# Patient Record
Sex: Male | Born: 1960
Health system: Southern US, Community
[De-identification: ages and names within clinical notes are randomized; demographics above are authoritative.]

## PROBLEM LIST (undated history)

## (undated) DIAGNOSIS — I739 Peripheral vascular disease, unspecified: Secondary | ICD-10-CM

## (undated) DIAGNOSIS — M1990 Unspecified osteoarthritis, unspecified site: Secondary | ICD-10-CM

## (undated) HISTORY — PX: VARICOSE VEIN SURGERY: SHX832

## (undated) HISTORY — PX: KNEE ARTHROSCOPY: SHX127

---

## 2000-04-12 ENCOUNTER — Ambulatory Visit (HOSPITAL_COMMUNITY): Admission: RE | Admit: 2000-04-12 | Discharge: 2000-04-12 | Payer: Self-pay | Admitting: Plastic Surgery

## 2000-04-12 ENCOUNTER — Encounter: Payer: Self-pay | Admitting: Plastic Surgery

## 2000-04-14 ENCOUNTER — Encounter: Payer: Self-pay | Admitting: Plastic Surgery

## 2000-04-14 ENCOUNTER — Ambulatory Visit (HOSPITAL_COMMUNITY): Admission: RE | Admit: 2000-04-14 | Discharge: 2000-04-14 | Payer: Self-pay | Admitting: Plastic Surgery

## 2000-04-20 ENCOUNTER — Other Ambulatory Visit: Admission: RE | Admit: 2000-04-20 | Discharge: 2000-04-20 | Payer: Self-pay | Admitting: *Deleted

## 2000-04-25 ENCOUNTER — Ambulatory Visit (HOSPITAL_COMMUNITY): Admission: RE | Admit: 2000-04-25 | Discharge: 2000-04-25 | Payer: Self-pay | Admitting: Otolaryngology

## 2000-04-25 ENCOUNTER — Encounter: Payer: Self-pay | Admitting: Otolaryngology

## 2000-06-29 ENCOUNTER — Ambulatory Visit (HOSPITAL_BASED_OUTPATIENT_CLINIC_OR_DEPARTMENT_OTHER): Admission: RE | Admit: 2000-06-29 | Discharge: 2000-06-29 | Payer: Self-pay | Admitting: Plastic Surgery

## 2000-07-04 ENCOUNTER — Ambulatory Visit (HOSPITAL_BASED_OUTPATIENT_CLINIC_OR_DEPARTMENT_OTHER): Admission: RE | Admit: 2000-07-04 | Discharge: 2000-07-04 | Payer: Self-pay | Admitting: Plastic Surgery

## 2000-11-09 ENCOUNTER — Ambulatory Visit (HOSPITAL_BASED_OUTPATIENT_CLINIC_OR_DEPARTMENT_OTHER): Admission: RE | Admit: 2000-11-09 | Discharge: 2000-11-09 | Payer: Self-pay | Admitting: Plastic Surgery

## 2002-02-16 IMAGING — RF IR [PERSON_NAME]/EXT/UNI*R*
9 series · 16 of 16 positions shown · non-contrast
Comparison: none

FINDINGS
CLINICAL DATA: VENOUS MALFORMATION IN THE RIGHT UPPER EXTREMITY.
RIGHT UPPER EXTREMITY VENOGRAM 04/14/00:
INITIALLY, A LARGE BORE IV WAS STARTED IN THE PATIENT'S WRIST.  VENOGRAM PERFORMED OVER THE FOREARM
REGION SHOWS TWO ABNORMAL APPEARING VEINS, ONE IN THE MID TO DISTAL FOREARM AND ONE IN THE PROXIMAL
FOREARM LATERALLY.  THERE ARE TWO AREAS WITHIN THESE VEINS THAT APPEAR SOMEWHAT DILATED AND
IRREGULAR.  THERE ARE MULTIPLE FILLING VESSELS TO EACH OF THESE ABNORMAL AREAS.  THE FILLING VEINS
ARE VERY SMALL.  THE REMAINDER OF THE VENOUS MALFORMATIONS SEEN ON PHYSICAL EXAM DO NOT FILL ON
VENOGRAM.  ON PHYSICAL EXAM, THERE IS A LARGE APPARENT VENOUS MALFORMATION LATERALLY IN THE FOREARM
JUST BELOW THE ELBOW AND MULTIPLE ABOVE THE ELBOW ALONG THE VENTRAL SURFACE.  NONE OF THESE AREAS
FILL WITH THE VENOGRAM.  THEREFORE, A SECOND IV WAS STARTED IN THE ANTECUBICLE FOSSA WITH A 20
GAUGE ANGIOCATH.  REPEAT VENOGRAM WAS PERFORMED BOTH WITH THE TOURNIQUET DOWN AND WITH THE
TOURNIQUET TIGHTENED.  AGAIN, ONLY THE DEEP VEINS OF THE UPPER ARM FILL WITHOUT FILLING OF THE
ABNORMAL VEINS.
IMPRESSION
1.  ONLY TWO ABNORMAL VEINS FILL IN THE MID AND PROXIMAL FOREARM ON VENOGRAM.  THE REMAINDER OF THE
AREA SEEN ON PHYSICAL EXAM DO NOT FILL WITH VENOGRAM. IF FURTHER EVALUATION IS FELT WARRENTED, MRI
MAY BE HELPFUL.
2.  THE TWO AREAS IN THE FOREARM THAT DO FILL APPEAR MILDLY DILATED AND IRREGULAR OVER A CENTIMETER
OR SO SEGMENT.  THERE ARE MULTIPLE TINY FILLING VEINS FOR EACH AREA.

[Series 1: run · 1 of 1 slices shown (1 of 9)]
[im 1/1]
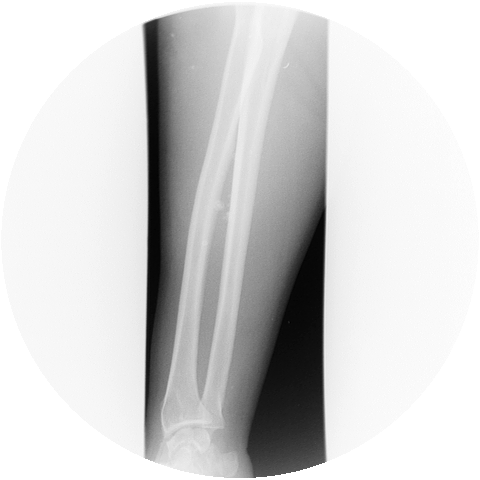

[Series 2: run · 1 of 1 slices shown (2 of 9)]
[im 1/1]
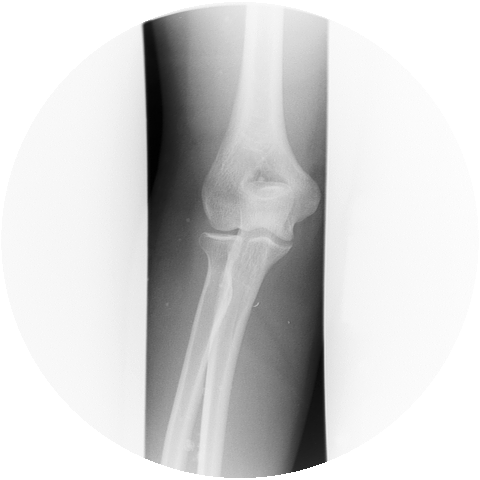

[Series 3: run · 1 of 1 slices shown (3 of 9)]
[im 1/1]
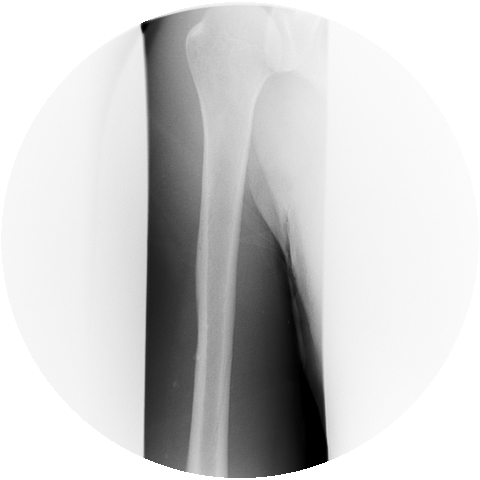

[Series 4: run · 3 of 8 slices shown (4 of 9)]
[im 1/8]
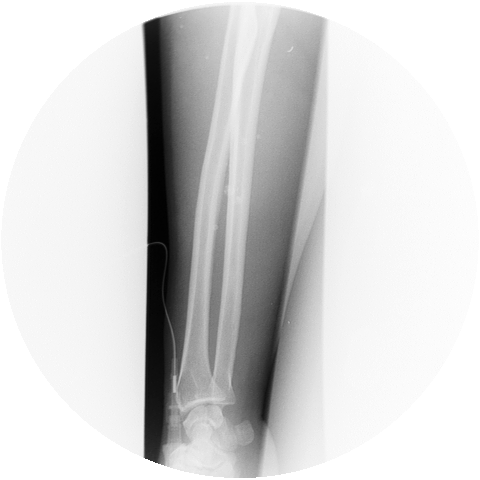
[im 4/8]
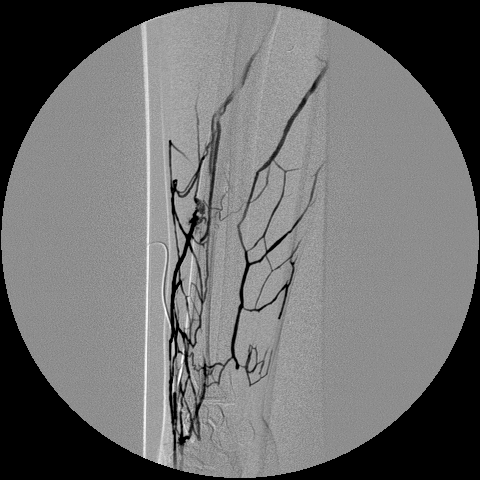
[im 8/8]
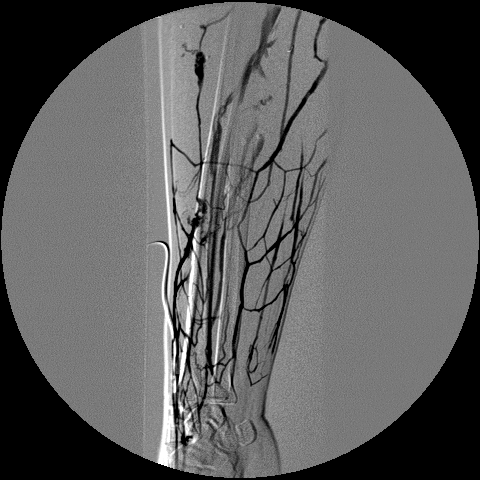

[Series 5: run · 1 of 1 slices shown (5 of 9)]
[im 1/1]
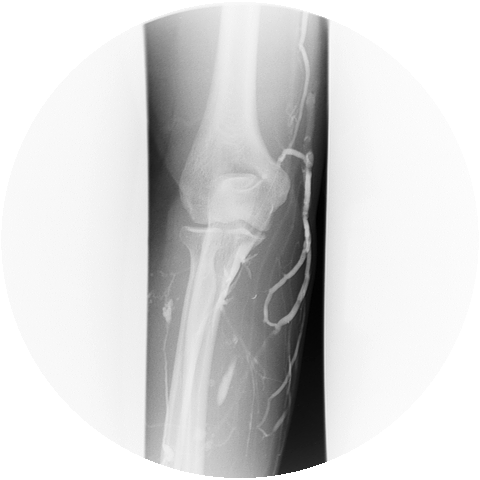

[Series 6: run · 3 of 8 slices shown (6 of 9)]
[im 1/8]
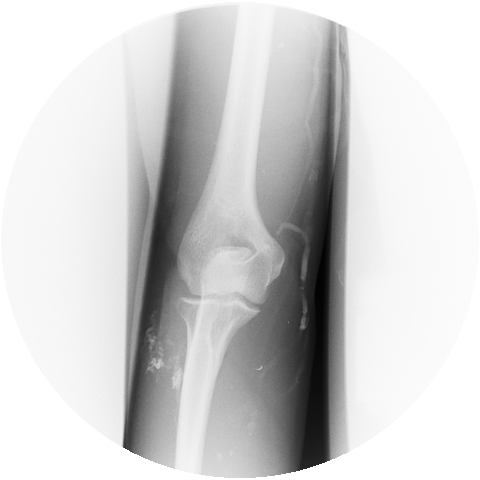
[im 4/8]
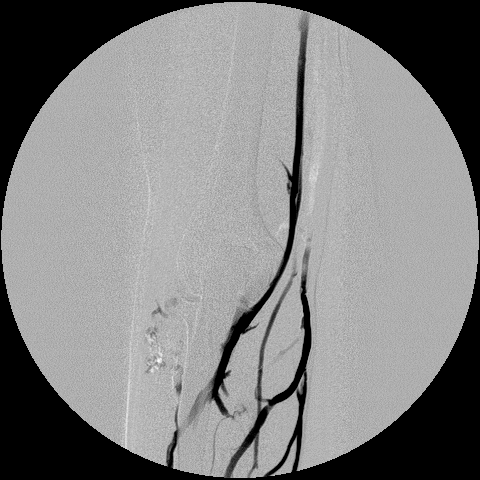
[im 8/8]
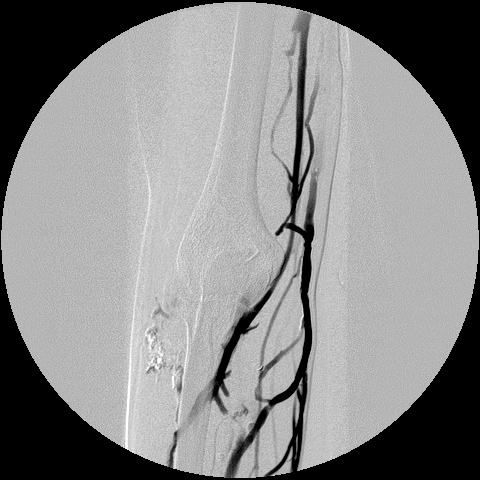

[Series 7: run · 1 of 1 slices shown (7 of 9)]
[im 1/1]
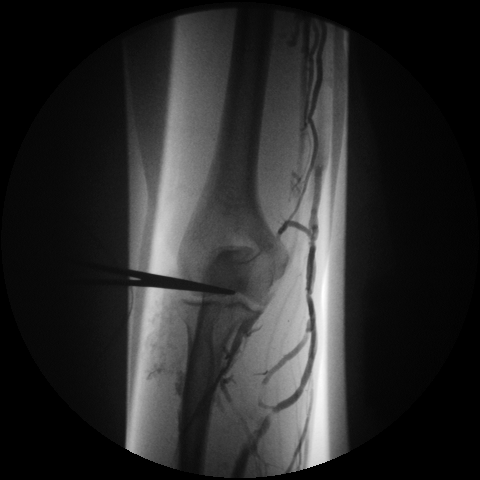

[Series 8: run · 2 of 7 slices shown (8 of 9)]
[im 1/7]
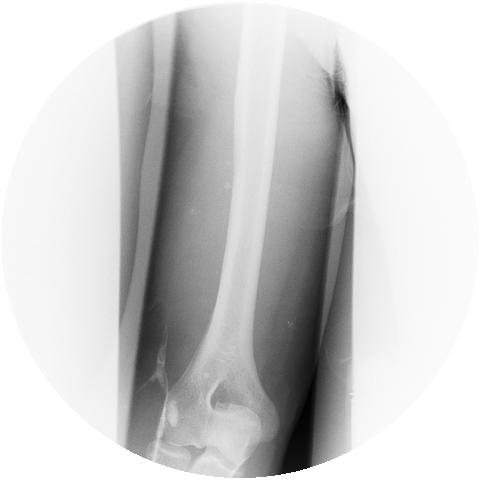
[im 7/7]
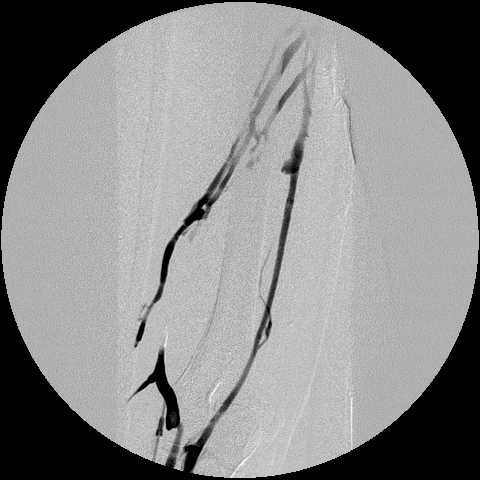

[Series 9: run · 3 of 6 slices shown (9 of 9)]
[im 1/6]
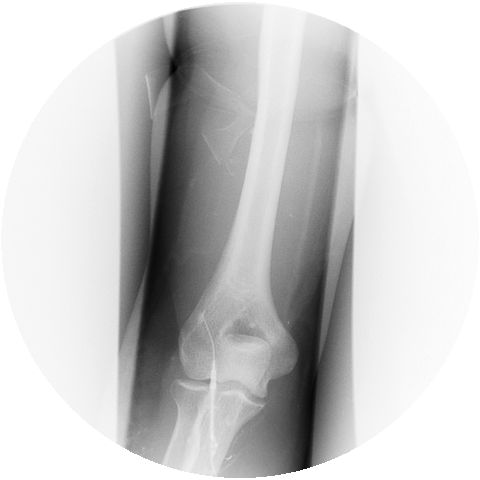
[im 3/6]
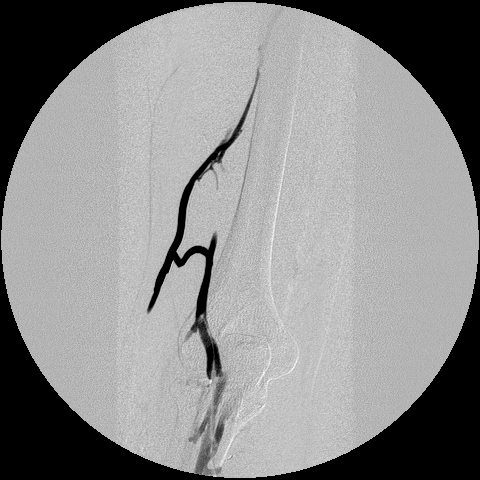
[im 6/6]
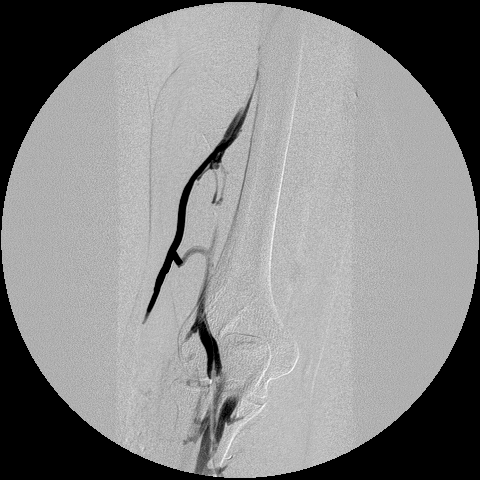

[16 of 16 positions shown; findings below may reference images not displayed]

## 2007-11-04 ENCOUNTER — Ambulatory Visit (HOSPITAL_COMMUNITY): Admission: RE | Admit: 2007-11-04 | Discharge: 2007-11-04 | Payer: Self-pay | Admitting: Family Medicine

## 2007-11-30 ENCOUNTER — Ambulatory Visit (HOSPITAL_COMMUNITY): Admission: RE | Admit: 2007-11-30 | Discharge: 2007-11-30 | Payer: Self-pay | Admitting: Orthopedic Surgery

## 2007-11-30 ENCOUNTER — Ambulatory Visit: Payer: Self-pay | Admitting: Vascular Surgery

## 2007-11-30 ENCOUNTER — Encounter (INDEPENDENT_AMBULATORY_CARE_PROVIDER_SITE_OTHER): Payer: Self-pay | Admitting: Orthopedic Surgery

## 2010-04-09 ENCOUNTER — Encounter
Admission: RE | Admit: 2010-04-09 | Discharge: 2010-04-20 | Payer: Self-pay | Source: Home / Self Care | Attending: Orthopedic Surgery | Admitting: Orthopedic Surgery

## 2010-04-21 ENCOUNTER — Ambulatory Visit: Payer: Federal, State, Local not specified - PPO | Admitting: Physical Therapy

## 2010-04-21 ENCOUNTER — Ambulatory Visit: Payer: Federal, State, Local not specified - PPO | Attending: Orthopedic Surgery | Admitting: Physical Therapy

## 2010-04-21 DIAGNOSIS — R5381 Other malaise: Secondary | ICD-10-CM | POA: Insufficient documentation

## 2010-04-21 DIAGNOSIS — M25569 Pain in unspecified knee: Secondary | ICD-10-CM | POA: Insufficient documentation

## 2010-04-21 DIAGNOSIS — IMO0001 Reserved for inherently not codable concepts without codable children: Secondary | ICD-10-CM | POA: Insufficient documentation

## 2010-04-23 ENCOUNTER — Encounter: Payer: Self-pay | Admitting: Physical Therapy

## 2010-08-06 NOTE — Op Note (Signed)
[place]. [facility]  Patient:    RALSTON, VENUS                       MRN: [id number] Proc. Date: 07/04/00 Adm. Date:  [id number] Attending:  Eloise Levels                           Operative Report  PREOPERATIVE DIAGNOSIS:  Open wound, right forearm, status post alcohol embolization of venous malformation, with skin and soft tissue necrosis.  POSTOPERATIVE DIAGNOSIS:  Open wound, right forearm, status post alcohol embolization of venous malformation, with skin and soft tissue necrosis.  PROCEDURES: 1. Local fasciocutaneous flap closure of right forearm wound. 2. Excisional debridement of right forearm wound (5 x 6 cm).  SURGEON:  Mary A. Contogiannis, M.D.  ANESTHESIA:  IV sedation along with 1% lidocaine with epinephrine.  ANESTHESIOLOGIST:  Halford Decamp, M.D.  COMPLICATIONS:  None.  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  On May 30, 2000, he underwent alcohol embolization of a large area of the venous malformation on the right forearm just lateral to the antecubital fossa.  As a result, he developed skin and soft tissue necrosis over an area that measured about 5 x 6 cm.  The patient was brought to the operating room last Thursday, where he underwent excisional debridement of the necrotic tissue in the wound. He has been doing normal saline wet-to-dry dressing changes since then, and the wound is clean and healthy with good granulation tissue present.  He is brought at this time to undergo excisional debridement of the wound with a local fasciocutaneous flap closure.  DESCRIPTION OF PROCEDURE:  The patient was brought in the operating room and placed on the OR table in supine position.  IV sedation was obtained, and the patients right arm was prepped with Betadine and alcohol and draped sterilely in a circumferential fashion.  Skin  and subcutaneous tissues around the areas of the skin wound were then injected with 1% lidocaine with epinephrine. After adequate anesthesia and hemostasis had taken effect, the procedure was begun.  The wound was excised, leaving good, clean skin margins behind.  All of the granulation tissue and scar tissue were removed.  There was some dense, adherent scar down to the muscle-fascial layer.  This was debrided off of the wound bed.  A local fasciocutaneous flap was then elevated to allow closure of the wound.  Meticulous hemostasis was obtained.  The flap dissection was performed both with the scissors as well as the Bovie electrocautery.  It did appear that the skin edges would close; however, there was just a small amount of tension.  The patients elbow was then flexed, and this allowed closure of the wound without tension across the incision.  The wound was irrigated with saline irrigation.  Meticulous hemostasis was obtained with the Bovie electrocautery.  The fascial layer was closed using 2-0 Monocryl sutures.  The skin was then closed with 3-0 Monocryl interrupted subdermal sutures, followed by a 4-0 Monocryl running intracuticular stitch on the skin.  A TLS drain had been placed into the wound and was brought out distal to the incision on the forearm using the trocar.  The drain was sutured in place using a 3-0 nylon suture.  The drain was then activated.  The incision was dressed with  Adaptic and 4 x 4s, followed by a Kling and Ace bandage.  The drain was then taped to the patients forearm dressing.  There were no complications.  The patient tolerated the procedure well.  He was then awakened from the IV sedation and taken to the recovery room in stable condition.  The patients right upper extremity was placed in a shoulder sling to prevent extension of the elbow. He will need to maintain the arm in flexion at the elbow in order to take stress off the incision.  Postoperatively, I  went over these instructions with both the patient as well as his significant other, who will be taking care of him.  I in particular asked him to keep the arm elevated at all times.  He is to wear the sling and keep his elbow in flexion.  He is to avoid extension of the elbow.  They were taught how to change the drainage tube of the TLS drainage system, to also record the amounts for me.  The patient will take his postoperative antibiotics.  He will also take his pain medication as needed. He was instructed to not shower tonight or tomorrow.  He can bathe, but he cannot get that right upper extremity wet.  He was also instructed to not participate in physical activities that are strenuous in any nature to his arm at least over the next two weeks.  In particular, he is to avoid lifting weights, playing sports, and aerobic exercise.  He is also to avoid situations where he will be sweating and possibly cause increased swelling of the extremity.  The patient was discharged home in stable condition and will return to the office tomorrow for a follow-up appointment. DD:  07/04/00 TD:  07/05/00 Job: [id number]

## 2010-08-06 NOTE — Op Note (Signed)
[place]. [facility]  Patient:    Willie Phillips, Willie Phillips                       MRN: [id number] Proc. Date: 06/29/00 Adm. Date:  [id number] Attending:  Eloise Levels                           Operative Report  PREOPERATIVE DIAGNOSIS:  Skin and soft tissue necrosis secondary to alcohol embolization of right forearm/elbow venous malformation.  POSTOPERATIVE DIAGNOSIS:  Skin and soft tissue necrosis secondary to alcohol embolization of right forearm/elbow venous malformation.  PROCEDURE:  Excisional debridement of 5 x 6 cm skin and soft tissue necrosis of right forearm/elbow wound.  SURGEON:  Mary A. Contogiannis, M.D.  ANESTHESIA:  1% lidocaine with epinephrine.  COMPLICATIONS:  None.  INDICATIONS FOR PROCEDURE:  The patient is a 50 year old Caucasian male, who was born with a venous malformation of the right upper extremity.  On May 30, 2000, he underwent embolization of the malformation using alcohol in a large grouping that was present in the proximal right forearm.  As a result of this, he has developed some skin and soft tissue necrosis in the area.  He is now at the point, where he needs to have the necrotic tissue debrided.  The patient would like to proceed with that at this point.  Next week, if the debridement goes okay and the wound stays clean, we will proceed with a local flap closure of the wound to try to avoid using a skin graft in this area.  DESCRIPTION OF PROCEDURE:  The patient was brought into the procedure room and placed on the table in supine position.  The skin from the middle upper arm to the wrist was prepped in a circumferential fashion with Betadine and draped in a sterile fashion.  The skin and subcutaneous tissues around the edges of the wound were then injected with 1% lidocaine with epinephrine.  After adequate hemostasis and anesthesia had taken effect, the procedure was begun.  All of the necrotic tissue was excised.   Some of the granulation tissue that had formed along with the fibrinous exudate was also sharply debrided.  Meticulous hemostasis was obtained with the Bovie electrocautery.  This gave a clean base to the wound.  There were no signs of infection present after the debridement and the wound really was not infected, rather it had necrotic tissue present. The wound was then dressed with a normal saline moist to dry dressing, followed by Kerlix.  There were no complications.  The patient tolerated the procedure well.  He was then discharged home in stable condition.  He will take his perioperative Keflex.  Follow-up appointment will be tomorrow in the office to discuss the next stage of his surgery, as well as for wound check. DD:  06/29/00 TD:  06/29/00 Job: [id number]

## 2010-08-06 NOTE — Op Note (Signed)
[place]. [facility]  Patient:    DAMANY, EASTMAN Visit Number: [id number] MRN: [id number]          Service Type: DSU Location: [facility] Attending Physician:  Eloise Levels Proc. Date: 11/09/00 Adm. Date:  11/09/2000                             Operative Report  PREOPERATIVE DIAGNOSES: 1. A 3.5 cm open wound, right upper arm, status post embolization of venous    malformation. 2. A 3.0 cm open wound, right cubital fossa, status post embolization of    venous malformation.  PROCEDURES: 1. Excision of 3.5 cm open wound, right upper arm. 2. Complex closure of 5.0 cm incision, right upper arm. 3. Excision of 3.0 cm open wound, right cubital fossa. 4. Complex closure of 4.0 cm incision, right cubital fossa.  SURGEON:  Mary A. Contogiannis, M.D.  ANESTHESIA:  1% lidocaine with epinephrine.  COMPLICATIONS:  None.  INDICATIONS FOR PROCEDURE:  The patient is a 50 year old Caucasian male who has a venous malformation along his right upper extremity.  He recently has had part of the venous malformation embolized in two areas.  As a result, he has full-thickness skin and partial-thickness soft tissue necrosis of these areas.  These wounds need to be excised and then closed primarily to heal the wound over.  The patient asked that I proceed with this at this time.  DESCRIPTION OF PROCEDURE:  The patient was brought to the procedure room and placed on the OR table in supine position.  The right upper extremity was prepped with Betadine and draped in a sterile fashion.  The skin and subcutaneous tissues in the area of both open wounds was injected with 1% lidocaine with epinephrine.  After adequate hemostasis and anesthesia had taken effect, the procedure was begun.  First the upper arm open wound was excised full-thickness through the skin and subcutaneous tissue down to the fascial layer, as those tissues appeared involved.  The wound  appeared unaffected.  Next, the skin edges were undermined for better closure of the wound.  Meticulous hemostasis was obtained with the Bovie electrocautery.  The wound was then closed in complex fashion.  The deeper subcutaneous tissues were closed with 3-0 Monocryl suture.  The dermal layer was closed with 3-0 Monocryl interrupted sutures.  The skin was then closed with a 4-0 Monocryl running intracuticular stitch.  Attention was then turned to the cubital fossa wound.  This wound was excised full-thickness from the skin down to just above the fascial layer.  The skin edges were then undermined for closure.  Skin wound hemostasis was obtained with the Bovie electrocautery.  The wound was then closed in complex fashion.  The skin and subcutaneous tissues were closed with 3-0 Monocryl sutures.  The dermal layer was closed with 3-0 Monocryl sutures, followed by the skin being closed with a 4-0 Monocryl running intracuticular stitch.  Both incisions were dressed with bacitracin ointment, Adaptic, and a 4 x 4 gauze and Kling.  The elbow did have to be flexed a little in order to take tension off of the cubital fossa incision.  The patient already has a right arm sling from previous surgery to that right arm. I have asked him to use that sling to keep the elbow slightly flexed as the wound healed in.  He said he will do so.  He will finish out his perioperative antibiotics.  The patient was discharged home in stable condition and will return to the office tomorrow for follow-up appointment. Attending Physician:  Eloise Levels DD:  11/09/00 TD:  11/10/00 Job: [phone] [id number]

## 2011-10-12 DIAGNOSIS — I770 Arteriovenous fistula, acquired: Secondary | ICD-10-CM | POA: Insufficient documentation

## 2014-06-16 ENCOUNTER — Encounter: Payer: Self-pay | Admitting: Podiatry

## 2014-06-16 ENCOUNTER — Ambulatory Visit (INDEPENDENT_AMBULATORY_CARE_PROVIDER_SITE_OTHER): Payer: PRIVATE HEALTH INSURANCE | Admitting: Podiatry

## 2014-06-16 VITALS — BP 130/85 | HR 56 | Resp 11 | Ht 70.0 in | Wt 190.0 lb

## 2014-06-16 DIAGNOSIS — B351 Tinea unguium: Secondary | ICD-10-CM

## 2014-06-16 LAB — HEPATIC FUNCTION PANEL
ALT: 17 U/L (ref 0–53)
AST: 19 U/L (ref 0–37)
Albumin: 4.2 g/dL (ref 3.5–5.2)
Alkaline Phosphatase: 58 U/L (ref 39–117)
Bilirubin, Direct: 0.2 mg/dL (ref 0.0–0.3)
Indirect Bilirubin: 1 mg/dL (ref 0.2–1.2)
Total Bilirubin: 1.2 mg/dL (ref 0.2–1.2)
Total Protein: 6.7 g/dL (ref 6.0–8.3)

## 2014-06-16 MED ORDER — TERBINAFINE HCL 250 MG PO TABS
250.0000 mg | ORAL_TABLET | Freq: Every day | ORAL | Status: DC
Start: 2014-06-16 — End: 2016-11-24

## 2014-06-16 NOTE — Progress Notes (Signed)
   Subjective:    Patient ID: Willie Phillips, male    DOB: Aug 21, 1960, 54 y.o.   MRN: [id number]  HPI    Review of Systems  All other systems reviewed and are negative.      Objective:   Physical Exam        Assessment & Plan:

## 2014-06-16 NOTE — Progress Notes (Signed)
Subjective:     Patient ID: Willie ChenBrian P Phillips, male   DOB: [birth year], 54 y.o.   MRN: [id number]  HPI patient presents with thick hallux nails of both feet that had been treated previously with antifungal but he was unable to take the complete treatment and improved but then the big toenail right has come back to a significant degree   Review of Systems     Objective:   Physical Exam Neurovascular status intact with thick yellow brittle nailbeds right big toenail distal two third    Assessment:     Mycotic nail infection    Plan:     Reviewed systemic versus localized type infection. At this point where can start him on a antifungal and he is placed on Lamisil 250 mg for 90 days and will have a liver function test done. Also I reviewed formula 3 which was dispensed and he will reappoint and may have to consider laser if symptoms persist

## 2016-11-24 ENCOUNTER — Ambulatory Visit: Payer: BLUE CROSS/BLUE SHIELD

## 2016-11-24 ENCOUNTER — Encounter: Payer: Self-pay | Admitting: Podiatry

## 2016-11-24 ENCOUNTER — Other Ambulatory Visit: Payer: Self-pay | Admitting: Podiatry

## 2016-11-24 ENCOUNTER — Ambulatory Visit (INDEPENDENT_AMBULATORY_CARE_PROVIDER_SITE_OTHER): Payer: BLUE CROSS/BLUE SHIELD | Admitting: Podiatry

## 2016-11-24 ENCOUNTER — Ambulatory Visit (INDEPENDENT_AMBULATORY_CARE_PROVIDER_SITE_OTHER): Payer: BLUE CROSS/BLUE SHIELD

## 2016-11-24 DIAGNOSIS — M722 Plantar fascial fibromatosis: Secondary | ICD-10-CM

## 2016-11-24 DIAGNOSIS — M79671 Pain in right foot: Secondary | ICD-10-CM

## 2016-11-24 MED ORDER — DICLOFENAC SODIUM 75 MG PO TBEC: 75.0000 mg | DELAYED_RELEASE_TABLET | Freq: Two times a day (BID) | ORAL | 2 refills | Status: DC

## 2016-11-24 MED ORDER — TRIAMCINOLONE ACETONIDE 10 MG/ML IJ SUSP
10.0000 mg | Freq: Once | INTRAMUSCULAR | Status: AC
  Administered 2016-11-24: 10 mg

## 2016-11-24 NOTE — Patient Instructions (Signed)

## 2016-11-24 NOTE — Progress Notes (Signed)
Subjective:    Patient ID: Willie Phillips, male   DOB: 56 y.o.   MRN: [id number]   HPI patient presents stating I have had a lot of pain in my right heel of 1 year duration and I know my arch is flat    ROS      Objective:  Physical Exam neurovascular status intact with exquisite discomfort right plantar fashion insertional point tendon the calcaneus with patient who is a football and lacrosse right 3     Assessment:    Acute plantar fasciitis right     Plan:    H&P x-ray reviewed and injected the plantar fascial right 3 mg Kenalog 5 mill grams Xylocaine and applied fascial brace gave instructions on physical therapy and discussed long-term orthotics for this condition. Reappoint 2 weeks  X-rays indicate small spur with no indication of stress fracture arthritis with moderate depression of the arch

## 2016-12-08 ENCOUNTER — Ambulatory Visit (INDEPENDENT_AMBULATORY_CARE_PROVIDER_SITE_OTHER): Payer: BLUE CROSS/BLUE SHIELD

## 2016-12-08 ENCOUNTER — Encounter: Payer: Self-pay | Admitting: Podiatry

## 2016-12-08 ENCOUNTER — Ambulatory Visit (INDEPENDENT_AMBULATORY_CARE_PROVIDER_SITE_OTHER): Payer: BLUE CROSS/BLUE SHIELD | Admitting: Podiatry

## 2016-12-08 DIAGNOSIS — M722 Plantar fascial fibromatosis: Secondary | ICD-10-CM

## 2016-12-08 DIAGNOSIS — S99922A Unspecified injury of left foot, initial encounter: Secondary | ICD-10-CM

## 2016-12-08 DIAGNOSIS — L03032 Cellulitis of left toe: Secondary | ICD-10-CM

## 2016-12-08 NOTE — Patient Instructions (Signed)

## 2016-12-08 NOTE — Progress Notes (Signed)
Subjective:    Patient ID: Willie Phillips, male   DOB: 56 y.o.   MRN: [id number]   HPI patient states my right heel is improved but still painful and I'm very active with my right vein and I know on getting need orthotics and I been having more more problems with his left big toenail    ROS      Objective:  Physical Exam neurovascular status intact with inflammation redness in the lateral border left hallux nail with incurvation of the bed and the right heel is quite sore in the medial band but improved from previous visit     Assessment:   Paronychia left hallux with still inflammation pain of the right plantar heel      Plan:  H&P conditions reviewed recommend correction of nail and I infiltrated left hallux 60 mg like Marcaine mixture removed the lateral border created channel for drainage and applied sterile dressing. 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 He is scheduled to see her back for orthotic casting

## 2016-12-15 ENCOUNTER — Ambulatory Visit: Payer: BLUE CROSS/BLUE SHIELD | Admitting: Orthotics

## 2016-12-15 DIAGNOSIS — L03032 Cellulitis of left toe: Secondary | ICD-10-CM

## 2016-12-15 DIAGNOSIS — M722 Plantar fascial fibromatosis: Secondary | ICD-10-CM

## 2016-12-15 NOTE — Progress Notes (Signed)

## 2016-12-19 DIAGNOSIS — S60351A Superficial foreign body of right thumb, initial encounter: Secondary | ICD-10-CM | POA: Diagnosis not present

## 2017-01-05 ENCOUNTER — Ambulatory Visit (INDEPENDENT_AMBULATORY_CARE_PROVIDER_SITE_OTHER): Payer: BLUE CROSS/BLUE SHIELD | Admitting: Orthotics

## 2017-01-05 DIAGNOSIS — M79671 Pain in right foot: Secondary | ICD-10-CM

## 2017-01-05 DIAGNOSIS — M722 Plantar fascial fibromatosis: Secondary | ICD-10-CM

## 2017-01-05 NOTE — Progress Notes (Signed)
Patient came in today to pick up custom made foot orthotics.  The goals were accomplished and the patient reported no dissatisfaction with said orthotics.  Patient was advised of breakin period and how to report any issues. 

## 2018-03-28 DIAGNOSIS — M1712 Unilateral primary osteoarthritis, left knee: Secondary | ICD-10-CM | POA: Insufficient documentation

## 2018-03-28 DIAGNOSIS — M25562 Pain in left knee: Secondary | ICD-10-CM | POA: Diagnosis not present

## 2018-03-28 DIAGNOSIS — M13862 Other specified arthritis, left knee: Secondary | ICD-10-CM | POA: Diagnosis not present

## 2018-04-19 DIAGNOSIS — Z131 Encounter for screening for diabetes mellitus: Secondary | ICD-10-CM | POA: Diagnosis not present

## 2018-04-19 DIAGNOSIS — Z136 Encounter for screening for cardiovascular disorders: Secondary | ICD-10-CM | POA: Diagnosis not present

## 2018-04-19 DIAGNOSIS — Z713 Dietary counseling and surveillance: Secondary | ICD-10-CM | POA: Diagnosis not present

## 2018-04-19 DIAGNOSIS — Z13220 Encounter for screening for lipoid disorders: Secondary | ICD-10-CM | POA: Diagnosis not present

## 2018-05-31 ENCOUNTER — Encounter: Payer: Self-pay | Admitting: Podiatry

## 2018-05-31 ENCOUNTER — Ambulatory Visit (INDEPENDENT_AMBULATORY_CARE_PROVIDER_SITE_OTHER): Payer: BLUE CROSS/BLUE SHIELD | Admitting: Podiatry

## 2018-05-31 ENCOUNTER — Ambulatory Visit (INDEPENDENT_AMBULATORY_CARE_PROVIDER_SITE_OTHER): Payer: BLUE CROSS/BLUE SHIELD

## 2018-05-31 ENCOUNTER — Other Ambulatory Visit: Payer: Self-pay

## 2018-05-31 ENCOUNTER — Other Ambulatory Visit: Payer: Self-pay | Admitting: Podiatry

## 2018-05-31 DIAGNOSIS — M79672 Pain in left foot: Secondary | ICD-10-CM

## 2018-05-31 DIAGNOSIS — M722 Plantar fascial fibromatosis: Secondary | ICD-10-CM

## 2018-05-31 MED ORDER — DICLOFENAC SODIUM 75 MG PO TBEC: 75.0000 mg | DELAYED_RELEASE_TABLET | Freq: Two times a day (BID) | ORAL | 2 refills | Status: AC

## 2018-05-31 MED ORDER — TRIAMCINOLONE ACETONIDE 10 MG/ML IJ SUSP
10.0000 mg | Freq: Once | INTRAMUSCULAR | Status: AC
  Administered 2018-05-31: 10 mg

## 2018-06-04 NOTE — Progress Notes (Signed)
Subjective:   Patient ID: Willie Phillips, male   DOB: 58 y.o.   MRN: [id number]   HPI Patient presents stating a lot of pain in the bottom of the left heel that is been going on for a week.  Patient states that it is been getting gradually worse and making walking difficult   ROS      Objective:  Physical Exam  Neurovascular status intact muscle strength is adequate patient left plantar heel being sore with inflammation fluid which is developed recently     Assessment:  Acute plantar fasciitis left     Plan:  H&P x-ray reviewed and today I injected the fascia 3 mg Kenalog 5 mg Xylocaine and instructed on physical therapy and reappoint to recheck  X-ray indicates small spur with no indications of stress fracture arthritis

## 2018-10-12 ENCOUNTER — Other Ambulatory Visit: Payer: Self-pay

## 2018-10-12 ENCOUNTER — Ambulatory Visit (INDEPENDENT_AMBULATORY_CARE_PROVIDER_SITE_OTHER): Payer: BC Managed Care – PPO | Admitting: Podiatry

## 2018-10-12 ENCOUNTER — Encounter: Payer: Self-pay | Admitting: Podiatry

## 2018-10-12 VITALS — Temp 97.7°F

## 2018-10-12 DIAGNOSIS — M722 Plantar fascial fibromatosis: Secondary | ICD-10-CM | POA: Diagnosis not present

## 2018-10-17 NOTE — Progress Notes (Signed)
Subjective:   Patient ID: Willie Phillips, male   DOB: 58 y.o.   MRN: [id number]   HPI Patient states he was doing well but started developed a lot of discomfort in his right heel over the last few weeks and he had gone hiking and seemed that that set it off   ROS      Objective:  Physical Exam  Neurovascular status intact with left foot doing well with inflammation pain right plantar fascia at the insertional point tendon calcaneus     Assessment:  Acute plantar fasciitis right with inflammation fluid buildup     Plan:  Sterile prep and injected the plantar fascial right 3 mg Kenalog 5 mg Xylocaine and instructed on physical therapy anti-inflammatories and supportive therapy.  Reappoint to recheck again in the next several weeks  X-rays indicate small spur with no indication to stress fracture arthritis

## 2019-05-01 ENCOUNTER — Encounter: Payer: Self-pay | Admitting: Podiatry

## 2019-05-01 ENCOUNTER — Ambulatory Visit (INDEPENDENT_AMBULATORY_CARE_PROVIDER_SITE_OTHER): Payer: BC Managed Care – PPO | Admitting: Podiatry

## 2019-05-01 ENCOUNTER — Ambulatory Visit (INDEPENDENT_AMBULATORY_CARE_PROVIDER_SITE_OTHER): Payer: BC Managed Care – PPO

## 2019-05-01 ENCOUNTER — Other Ambulatory Visit: Payer: Self-pay

## 2019-05-01 VITALS — Temp 96.5°F

## 2019-05-01 DIAGNOSIS — M722 Plantar fascial fibromatosis: Secondary | ICD-10-CM

## 2019-05-01 NOTE — Progress Notes (Signed)
Subjective:   Patient ID: Willie Phillips, male   DOB: 59 y.o.   MRN: [id number]   HPI Patient states he has been wrapping in the heel has started to hurt him again.  Patient has been running on his heel quite a bit and has been using his orthotics but not until the pain started   ROS      Objective:  Physical Exam  Acute plantar fasciitis left medial band     Assessment:  Pain to palpation medial band left heel with inflammation fluid buildup     Plan:  Reviewed conditions and recommended continuation of conservative treatment and did sterile prep reinjected the fascia 3 mg Kenalog 5 mg Xylocaine

## 2020-05-12 ENCOUNTER — Ambulatory Visit: Payer: Self-pay | Admitting: Student

## 2020-06-29 ENCOUNTER — Ambulatory Visit: Payer: Self-pay | Admitting: Student

## 2020-06-29 NOTE — H&P (Signed)
TOTAL KNEE ADMISSION H&P  Patient is being admitted for left total knee arthroplasty.  Subjective:  Chief Complaint:left knee pain.  HPI: Willie Phillips, 60 y.o. male, has a history of pain and functional disability in the left knee due to arthritis and has failed non-surgical conservative treatments for greater than 12 weeks to includeNSAID's and/or analgesics and activity modification.  Onset of symptoms was gradual, starting 2 years ago with gradually worsening course since that time. The patient noted no past surgery on the left knee(s).  Patient currently rates pain in the left knee(s) at 8 out of 10 with activity. Patient has worsening of pain with activity and weight bearing, pain that interferes with activities of daily living and pain with passive range of motion.  Patient has evidence of subchondral cysts, subchondral sclerosis and joint space narrowing by imaging studies. There is no active infection.  Patient Active Problem List   Diagnosis Date Noted  . Arthritis of left knee 03/28/2018  . Arteriovenous fistula (HCC) 10/12/2011   No past medical history on file.  No past surgical history on file.  Current Outpatient Medications  Medication Sig Dispense Refill Last Dose  . diclofenac (VOLTAREN) 75 MG EC tablet Take 1 tablet (75 mg total) by mouth 2 (two) times daily. 50 tablet 2   . GINSENG PO Take 1 capsule by mouth daily.     Marland Kitchen ibuprofen (ADVIL) 200 MG tablet Take 200-400 mg by mouth every 6 (six) hours as needed for moderate pain.     . simvastatin (ZOCOR) 10 MG tablet Take 10 mg by mouth every evening.     . vitamin E 180 MG (400 UNITS) capsule Take 400 Units by mouth daily.      No current facility-administered medications for this visit.   No Known Allergies  Social History   Tobacco Use  . Smoking status: Never Smoker  . Smokeless tobacco: Former Neurosurgeon    Types: Chew  Substance Use Topics  . Alcohol use: Not on file    No family history on file.   Review of  Systems  Musculoskeletal: Positive for arthralgias.  All other systems reviewed and are negative.   Objective:  Physical Exam Constitutional:      Appearance: Normal appearance.  HENT:     Head: Normocephalic.  Eyes:     Pupils: Pupils are equal, round, and reactive to light.  Cardiovascular:     Rate and Rhythm: Normal rate and regular rhythm.     Pulses: Normal pulses.  Pulmonary:     Effort: Pulmonary effort is normal.  Abdominal:     Palpations: Abdomen is soft.     Tenderness: There is no abdominal tenderness.  Genitourinary:    Comments: Deferred Musculoskeletal:     Cervical back: Normal range of motion.     Comments: Pain with palpation of the medial joint line. Painless logrolling of the hip  Skin:    General: Skin is warm and dry.  Neurological:     Mental Status: He is alert and oriented to person, place, and time.  Psychiatric:        Mood and Affect: Mood normal.     Vital signs in last 24 hours: @VSRANGES @  Labs:   Estimated body mass index is 27.26 kg/m as calculated from the following:   Height as of 06/16/14: 5\' 10"  (1.778 m).   Weight as of 06/16/14: 86.2 kg.   Imaging Review Plain radiographs demonstrate severe degenerative joint disease of the left  knee(s). The overall alignment isneutral. The bone quality appears to be adequate for age and reported activity level.      Assessment/Plan:  End stage arthritis, left knee   The patient history, physical examination, clinical judgment of the provider and imaging studies are consistent with end stage degenerative joint disease of the left knee(s) and total knee arthroplasty is deemed medically necessary. The treatment options including medical management, injection therapy arthroscopy and arthroplasty were discussed at length. The risks and benefits of total knee arthroplasty were presented and reviewed. The risks due to aseptic loosening, infection, stiffness, patella tracking problems,  thromboembolic complications and other imponderables were discussed. The patient acknowledged the explanation, agreed to proceed with the plan and consent was signed. Patient is being admitted for inpatient treatment for surgery, pain control, PT, OT, prophylactic antibiotics, VTE prophylaxis, progressive ambulation and ADL's and discharge planning. The patient is planning to be discharged home after observation     Patient's anticipated LOS is less than 2 midnights, meeting these requirements: - Younger than 54 - Lives within 1 hour of care - Has a competent adult at home to recover with post-op recover - NO history of  - Chronic pain requiring opiods  - Diabetes  - Coronary Artery Disease  - Heart failure  - Heart attack  - Stroke  - DVT/VTE  - Cardiac arrhythmia  - Respiratory Failure/COPD  - Renal failure  - Anemia  - Advanced Liver disease

## 2020-06-29 NOTE — H&P (View-Only) (Signed)
TOTAL KNEE ADMISSION H&P  Patient is being admitted for left total knee arthroplasty.  Subjective:  Chief Complaint:left knee pain.  HPI: Willie Phillips, 60 y.o. male, has a history of pain and functional disability in the left knee due to arthritis and has failed non-surgical conservative treatments for greater than 12 weeks to includeNSAID's and/or analgesics and activity modification.  Onset of symptoms was gradual, starting 2 years ago with gradually worsening course since that time. The patient noted no past surgery on the left knee(s).  Patient currently rates pain in the left knee(s) at 8 out of 10 with activity. Patient has worsening of pain with activity and weight bearing, pain that interferes with activities of daily living and pain with passive range of motion.  Patient has evidence of subchondral cysts, subchondral sclerosis and joint space narrowing by imaging studies. There is no active infection.  Patient Active Problem List   Diagnosis Date Noted  . Arthritis of left knee 03/28/2018  . Arteriovenous fistula (HCC) 10/12/2011   No past medical history on file.  No past surgical history on file.  Current Outpatient Medications  Medication Sig Dispense Refill Last Dose  . diclofenac (VOLTAREN) 75 MG EC tablet Take 1 tablet (75 mg total) by mouth 2 (two) times daily. 50 tablet 2   . GINSENG PO Take 1 capsule by mouth daily.     . ibuprofen (ADVIL) 200 MG tablet Take 200-400 mg by mouth every 6 (six) hours as needed for moderate pain.     . simvastatin (ZOCOR) 10 MG tablet Take 10 mg by mouth every evening.     . vitamin E 180 MG (400 UNITS) capsule Take 400 Units by mouth daily.      No current facility-administered medications for this visit.   No Known Allergies  Social History   Tobacco Use  . Smoking status: Never Smoker  . Smokeless tobacco: Former User    Types: Chew  Substance Use Topics  . Alcohol use: Not on file    No family history on file.   Review of  Systems  Musculoskeletal: Positive for arthralgias.  All other systems reviewed and are negative.   Objective:  Physical Exam Constitutional:      Appearance: Normal appearance.  HENT:     Head: Normocephalic.  Eyes:     Pupils: Pupils are equal, round, and reactive to light.  Cardiovascular:     Rate and Rhythm: Normal rate and regular rhythm.     Pulses: Normal pulses.  Pulmonary:     Effort: Pulmonary effort is normal.  Abdominal:     Palpations: Abdomen is soft.     Tenderness: There is no abdominal tenderness.  Genitourinary:    Comments: Deferred Musculoskeletal:     Cervical back: Normal range of motion.     Comments: Pain with palpation of the medial joint line. Painless logrolling of the hip  Skin:    General: Skin is warm and dry.  Neurological:     Mental Status: He is alert and oriented to person, place, and time.  Psychiatric:        Mood and Affect: Mood normal.     Vital signs in last 24 hours: @VSRANGES@  Labs:   Estimated body mass index is 27.26 kg/m as calculated from the following:   Height as of 06/16/14: 5' 10" (1.778 m).   Weight as of 06/16/14: 86.2 kg.   Imaging Review Plain radiographs demonstrate severe degenerative joint disease of the left   knee(s). The overall alignment isneutral. The bone quality appears to be adequate for age and reported activity level.      Assessment/Plan:  End stage arthritis, left knee   The patient history, physical examination, clinical judgment of the provider and imaging studies are consistent with end stage degenerative joint disease of the left knee(s) and total knee arthroplasty is deemed medically necessary. The treatment options including medical management, injection therapy arthroscopy and arthroplasty were discussed at length. The risks and benefits of total knee arthroplasty were presented and reviewed. The risks due to aseptic loosening, infection, stiffness, patella tracking problems,  thromboembolic complications and other imponderables were discussed. The patient acknowledged the explanation, agreed to proceed with the plan and consent was signed. Patient is being admitted for inpatient treatment for surgery, pain control, PT, OT, prophylactic antibiotics, VTE prophylaxis, progressive ambulation and ADL's and discharge planning. The patient is planning to be discharged home after observation     Patient's anticipated LOS is less than 2 midnights, meeting these requirements: - Younger than 60 - Lives within 1 hour of care - Has a competent adult at home to recover with post-op recover - NO history of  - Chronic pain requiring opiods  - Diabetes  - Coronary Artery Disease  - Heart failure  - Heart attack  - Stroke  - DVT/VTE  - Cardiac arrhythmia  - Respiratory Failure/COPD  - Renal failure  - Anemia  - Advanced Liver disease

## 2020-07-08 NOTE — Patient Instructions (Addendum)
DUE TO COVID-19 ONLY ONE VISITOR IS ALLOWED TO COME WITH YOU AND STAY IN THE WAITING ROOM ONLY DURING PRE OP AND PROCEDURE DAY OF SURGERY. THE 1 VISITOR  MAY VISIT WITH YOU AFTER SURGERY IN YOUR PRIVATE ROOM DURING VISITING HOURS ONLY!  YOU NEED TO HAVE A COVID 19 TEST ON_4/26______ @_8 :45____, THIS TEST MUST BE DONE BEFORE SURGERY,  COVID TESTING SITE [address] [place] , IT IS ON THE RIGHT GOING OUT [address] APPROXIMATELY  2 MINUTES PAST [employer] ON THE RIGHT. ONCE YOUR COVID TEST IS COMPLETED,  PLEASE BEGIN THE QUARANTINE INSTRUCTIONS AS OUTLINED IN YOUR HANDOUT.                Willie Phillips    Your procedure is scheduled on: 07/16/20   Report to [facility] Main  Entrance   Report to short stay at 5:15 AM     Call this number if you have problems the morning of surgery [phone]     BRUSH YOUR TEETH MORNING OF SURGERY AND RINSE YOUR MOUTH OUT, NO CHEWING GUM CANDY OR MINTS.  No food after midnight.    You may have clear liquid until 4:30 AM.    At 4:00 AM drink pre surgery drink  . Nothing by mouth after 4:30 AM.    Take these medicines the morning of surgery with A SIP OF WATER: none                                 You may not have any metal on your body including              piercings  Do not wear jewelry, lotions, powders or deodorant              Men may shave face and neck.   Do not bring valuables to the hospital. [facility] IS NOT             RESPONSIBLE   FOR VALUABLES.  Contacts, dentures or bridgework may not be worn into surgery.      Patients discharged the day of surgery will not be allowed to drive home.   IF YOU ARE HAVING SURGERY AND GOING HOME THE SAME DAY, YOU MUST HAVE AN ADULT TO DRIVE YOU HOME AND BE WITH YOU FOR 24 HOURS.  YOU MAY GO HOME BY TAXI OR UBER OR ORTHERWISE, BUT AN ADULT MUST ACCOMPANY YOU HOME AND STAY WITH YOU FOR 24 HOURS.  Name and phone number of your driver:  Special  Instructions: N/A              Please read over the following fact sheets you were given: _____________________________________________________________________             [facility] - Preparing for Surgery Before surgery, you can play an important role.  Because skin is not sterile, your skin needs to be as free of germs as possible.  You can reduce the number of germs on your skin by washing with CHG (chlorahexidine gluconate) soap before surgery.  CHG is an antiseptic cleaner which kills germs and bonds with the skin to continue killing germs even after washing. Please DO NOT use if you have an allergy to CHG or antibacterial soaps.  If your skin becomes reddened/irritated stop using the CHG and inform your nurse when you arrive at Short Stay.   You may  shave your face/neck.  Please follow these instructions carefully:  1.  Shower with CHG Soap the night before surgery and the  morning of Surgery.  2.  If you choose to wash your hair, wash your hair first as usual with your  normal  shampoo.  3.  After you shampoo, rinse your hair and body thoroughly to remove the  shampoo.                                        4.  Use CHG as you would any other liquid soap.  You can apply chg directly  to the skin and wash                       Gently with a scrungie or clean washcloth.  5.  Apply the CHG Soap to your body ONLY FROM THE NECK DOWN.   Do not use on face/ open                           Wound or open sores. Avoid contact with eyes, ears mouth and genitals (private parts).                       Wash face,  Genitals (private parts) with your normal soap.             6.  Wash thoroughly, paying special attention to the area where your surgery  will be performed.  7.  Thoroughly rinse your body with warm water from the neck down.  8.  DO NOT shower/wash with your normal soap after using and rinsing off  the CHG Soap.             9.  Pat yourself dry with a clean towel.            10.  Wear  clean pajamas.            11.  Place clean sheets on your bed the night of your first shower and do not  sleep with pets. Day of Surgery : Do not apply any lotions/deodorants the morning of surgery.  Please wear clean clothes to the hospital/surgery center.  FAILURE TO FOLLOW THESE INSTRUCTIONS MAY RESULT IN THE CANCELLATION OF YOUR SURGERY PATIENT SIGNATURE_________________________________  NURSE SIGNATURE__________________________________  ________________________________________________________________________   Willie Phillips  An incentive spirometer is a tool that can help keep your lungs clear and active. This tool measures how well you are filling your lungs with each breath. Taking long deep breaths may help reverse or decrease the chance of developing breathing (pulmonary) problems (especially infection) following:  A long period of time when you are unable to move or be active. BEFORE THE PROCEDURE   If the spirometer includes an indicator to show your best effort, your nurse or respiratory therapist will set it to a desired goal.  If possible, sit up straight or lean slightly forward. Try not to slouch.  Hold the incentive spirometer in an upright position. INSTRUCTIONS FOR USE  1. Sit on the edge of your bed if possible, or sit up as far as you can in bed or on a chair. 2. Hold the incentive spirometer in an upright position. 3. Breathe out normally. 4. Place the mouthpiece in your mouth and seal your lips tightly around it. 5.  Breathe in slowly and as deeply as possible, raising the piston or the ball toward the top of the column. 6. Hold your breath for 3-5 seconds or for as long as possible. Allow the piston or ball to fall to the bottom of the column. 7. Remove the mouthpiece from your mouth and breathe out normally. 8. Rest for a few seconds and repeat Steps 1 through 7 at least 10 times every 1-2 hours when you are awake. Take your time and take a few normal  breaths between deep breaths. 9. The spirometer may include an indicator to show your best effort. Use the indicator as a goal to work toward during each repetition. 10. After each set of 10 deep breaths, practice coughing to be sure your lungs are clear. If you have an incision (the cut made at the time of surgery), support your incision when coughing by placing a pillow or rolled up towels firmly against it. Once you are able to get out of bed, walk around indoors and cough well. You may stop using the incentive spirometer when instructed by your caregiver.  RISKS AND COMPLICATIONS  Take your time so you do not get dizzy or light-headed.  If you are in pain, you may need to take or ask for pain medication before doing incentive spirometry. It is harder to take a deep breath if you are having pain. AFTER USE  Rest and breathe slowly and easily.  It can be helpful to keep track of a log of your progress. Your caregiver can provide you with a simple table to help with this. If you are using the spirometer at home, follow these instructions: SEEK MEDICAL CARE IF:   You are having difficultly using the spirometer.  You have trouble using the spirometer as often as instructed.  Your pain medication is not giving enough relief while using the spirometer.  You develop fever of 100.5 F (38.1 C) or higher. SEEK IMMEDIATE MEDICAL CARE IF:   You cough up bloody sputum that had not been present before.  You develop fever of 102 F (38.9 C) or greater.  You develop worsening pain at or near the incision site. MAKE SURE YOU:   Understand these instructions.  Will watch your condition.  Will get help right away if you are not doing well or get worse. Document Released: 07/18/2006 Document Revised: 05/30/2011 Document Reviewed: 09/18/2006 [facility] Patient Information 2014 [place], [place].   ________________________________________________________________________

## 2020-07-09 ENCOUNTER — Encounter (HOSPITAL_COMMUNITY): Payer: Self-pay

## 2020-07-09 ENCOUNTER — Encounter (HOSPITAL_COMMUNITY)
Admission: RE | Admit: 2020-07-09 | Discharge: 2020-07-09 | Disposition: A | Discharge status: Home / Self Care | Payer: No Typology Code available for payment source | Source: Ambulatory Visit | Attending: Orthopedic Surgery | Admitting: Orthopedic Surgery

## 2020-07-09 ENCOUNTER — Other Ambulatory Visit: Payer: Self-pay

## 2020-07-09 DIAGNOSIS — Z01812 Encounter for preprocedural laboratory examination: Secondary | ICD-10-CM | POA: Insufficient documentation

## 2020-07-09 HISTORY — DX: Peripheral vascular disease, unspecified: I73.9

## 2020-07-09 HISTORY — DX: Unspecified osteoarthritis, unspecified site: M19.90

## 2020-07-09 LAB — URINALYSIS, ROUTINE W REFLEX MICROSCOPIC
Bilirubin Urine: NEGATIVE
Glucose, UA: NEGATIVE mg/dL
Hgb urine dipstick: NEGATIVE
Ketones, ur: NEGATIVE mg/dL
Leukocytes,Ua: NEGATIVE
Nitrite: NEGATIVE
Protein, ur: NEGATIVE mg/dL
Specific Gravity, Urine: 1.018 (ref 1.005–1.030)
pH: 6 (ref 5.0–8.0)

## 2020-07-09 LAB — COMPREHENSIVE METABOLIC PANEL
ALT: 43 U/L (ref 0–44)
AST: 39 U/L (ref 15–41)
Albumin: 4.1 g/dL (ref 3.5–5.0)
Alkaline Phosphatase: 56 U/L (ref 38–126)
Anion gap: 6 (ref 5–15)
BUN: 23 mg/dL — ABNORMAL HIGH (ref 6–20)
CO2: 27 mmol/L (ref 22–32)
Calcium: 9.1 mg/dL (ref 8.9–10.3)
Chloride: 104 mmol/L (ref 98–111)
Creatinine, Ser: 0.89 mg/dL (ref 0.61–1.24)
GFR, Estimated: >60 mL/min (ref >60)
Glucose, Bld: 108 mg/dL — ABNORMAL HIGH (ref 70–99)
Potassium: 4.3 mmol/L (ref 3.5–5.1)
Sodium: 137 mmol/L (ref 135–145)
Total Bilirubin: 0.7 mg/dL (ref 0.3–1.2)
Total Protein: 7 g/dL (ref 6.5–8.1)

## 2020-07-09 LAB — SURGICAL PCR SCREEN
MRSA, PCR: NEGATIVE
Staphylococcus aureus: POSITIVE — AB

## 2020-07-09 LAB — PROTIME-INR
INR: 0.9 (ref 0.8–1.2)
Prothrombin Time: 12.2 seconds (ref 11.4–15.2)

## 2020-07-09 LAB — CBC
HCT: 44.6 % (ref 39.0–52.0)
Hemoglobin: 14.5 g/dL (ref 13.0–17.0)
MCH: 31.1 pg (ref 26.0–34.0)
MCHC: 32.5 g/dL (ref 30.0–36.0)
MCV: 95.7 fL (ref 80.0–100.0)
Platelets: 224 10*3/uL (ref 150–400)
RBC: 4.66 MIL/uL (ref 4.22–5.81)
RDW: 12.3 % (ref 11.5–15.5)
WBC: 5.7 10*3/uL (ref 4.0–10.5)
nRBC: 0 % (ref 0.0–0.2)

## 2020-07-09 NOTE — Progress Notes (Signed)
COVID Vaccine Completed:yes Date COVID Vaccine completed:07/09/19 COVID vaccine manufacturer: Pfizer    PCP - [facility]  Cardiologist - none  Chest x-ray - no EKG - no Stress Test - no ECHO - no Cardiac Cath - no Pacemaker/ICD device last checked:NA  Sleep Study - no CPAP -   Fasting Blood Sugar - NA Checks Blood Sugar _____ times a day  Blood Thinner Instructions:NA Aspirin Instructions: Last Dose:  Anesthesia review:   Patient denies shortness of breath, fever, cough and chest pain at PAT appointment yes  Patient verbalized understanding of instructions that were given to them at the PAT appointment. Patient was also instructed that they will need to review over the PAT instructions again at home before surgery.yes Pt has no SOB with any activities. He works out regularly.

## 2020-07-14 ENCOUNTER — Other Ambulatory Visit (HOSPITAL_COMMUNITY)
Admission: RE | Admit: 2020-07-14 | Discharge: 2020-07-14 | Disposition: A | Discharge status: Home / Self Care | Payer: No Typology Code available for payment source | Source: Ambulatory Visit | Attending: Orthopedic Surgery | Admitting: Orthopedic Surgery

## 2020-07-14 DIAGNOSIS — Z01812 Encounter for preprocedural laboratory examination: Secondary | ICD-10-CM | POA: Insufficient documentation

## 2020-07-14 DIAGNOSIS — Z20822 Contact with and (suspected) exposure to covid-19: Secondary | ICD-10-CM | POA: Insufficient documentation

## 2020-07-14 LAB — SARS CORONAVIRUS 2 (TAT 6-24 HRS): SARS Coronavirus 2: NEGATIVE

## 2020-07-15 NOTE — Anesthesia Preprocedure Evaluation (Addendum)
Anesthesia Evaluation  Patient identified by MRN, date of birth, ID band Patient awake    Reviewed: Allergy & Precautions, NPO status , Patient's Chart, lab work & pertinent test results  Airway Mallampati: II  TM Distance: >3 FB Neck ROM: Full    Dental  (+) Teeth Intact   Pulmonary neg pulmonary ROS, former smoker,    Pulmonary exam normal        Cardiovascular + Peripheral Vascular Disease   Rhythm:Regular Rate:Normal     Neuro/Psych negative neurological ROS     GI/Hepatic negative GI ROS, Neg liver ROS,   Endo/Other  negative endocrine ROS  Renal/GU negative Renal ROS  negative genitourinary   Musculoskeletal  (+) Arthritis , Osteoarthritis,    Abdominal (+)  Abdomen: soft. Bowel sounds: normal.  Peds  Hematology negative hematology ROS (+)   Anesthesia Other Findings   Reproductive/Obstetrics                            Anesthesia Physical Anesthesia Plan  ASA: II  Anesthesia Plan: MAC, Regional and Spinal   Post-op Pain Management:  Regional for Post-op pain   Induction: Intravenous  PONV Risk Score and Plan: 1 and Ondansetron, Dexamethasone, Propofol infusion, Midazolam and Treatment may vary due to age or medical condition  Airway Management Planned: Simple Face Mask, Natural Airway and Nasal Cannula  Additional Equipment: None  Intra-op Plan:   Post-operative Plan: Extubation in OR  Informed Consent: I have reviewed the patients History and Physical, chart, labs and discussed the procedure including the risks, benefits and alternatives for the proposed anesthesia with the patient or authorized representative who has indicated his/her understanding and acceptance.     Dental advisory given  Plan Discussed with: CRNA  Anesthesia Plan Comments: (Lab Results      Component                Value               Date                      WBC                      5.7                  07/09/2020                HGB                      14.5                07/09/2020                HCT                      44.6                07/09/2020                MCV                      95.7                07/09/2020                PLT  224                 07/09/2020           Lab Results      Component                Value               Date                      NA                       137                 07/09/2020                K                        4.3                 07/09/2020                CO2                      27                  07/09/2020                GLUCOSE                  108 (H)             07/09/2020                BUN                      23 (H)              07/09/2020                CREATININE               0.89                07/09/2020                CALCIUM                  9.1                 07/09/2020                GFRNONAA                 >60                 07/09/2020          )       Anesthesia Quick Evaluation

## 2020-07-16 ENCOUNTER — Ambulatory Visit (HOSPITAL_COMMUNITY): Payer: No Typology Code available for payment source | Admitting: Anesthesiology

## 2020-07-16 ENCOUNTER — Ambulatory Visit (HOSPITAL_COMMUNITY): Payer: No Typology Code available for payment source

## 2020-07-16 ENCOUNTER — Ambulatory Visit (HOSPITAL_COMMUNITY)
Admission: RE | Admit: 2020-07-16 | Discharge: 2020-07-16 | Disposition: A | Discharge status: Home / Self Care | Payer: No Typology Code available for payment source | Source: Ambulatory Visit | Attending: Orthopedic Surgery | Admitting: Orthopedic Surgery

## 2020-07-16 ENCOUNTER — Encounter (HOSPITAL_COMMUNITY): Payer: Self-pay | Admitting: Orthopedic Surgery

## 2020-07-16 ENCOUNTER — Encounter (HOSPITAL_COMMUNITY)
Admission: RE | Disposition: A | Discharge status: Home / Self Care | Payer: Self-pay | Source: Ambulatory Visit | Attending: Orthopedic Surgery

## 2020-07-16 DIAGNOSIS — Z791 Long term (current) use of non-steroidal anti-inflammatories (NSAID): Secondary | ICD-10-CM | POA: Insufficient documentation

## 2020-07-16 DIAGNOSIS — M1712 Unilateral primary osteoarthritis, left knee: Secondary | ICD-10-CM | POA: Insufficient documentation

## 2020-07-16 DIAGNOSIS — M8568 Other cyst of bone, other site: Secondary | ICD-10-CM | POA: Diagnosis not present

## 2020-07-16 DIAGNOSIS — Z96652 Presence of left artificial knee joint: Secondary | ICD-10-CM

## 2020-07-16 DIAGNOSIS — Z79899 Other long term (current) drug therapy: Secondary | ICD-10-CM | POA: Diagnosis not present

## 2020-07-16 DIAGNOSIS — M25862 Other specified joint disorders, left knee: Secondary | ICD-10-CM | POA: Diagnosis not present

## 2020-07-16 HISTORY — PX: KNEE ARTHROPLASTY: SHX992

## 2020-07-16 LAB — TYPE AND SCREEN
ABO/RH(D): O POS
Antibody Screen: NEGATIVE

## 2020-07-16 LAB — ABO/RH: ABO/RH(D): O POS

## 2020-07-16 SURGERY — ARTHROPLASTY, KNEE, TOTAL, USING IMAGELESS COMPUTER-ASSISTED NAVIGATION
Anesthesia: Monitor Anesthesia Care | Site: Knee | Laterality: Left

## 2020-07-16 MED ORDER — POVIDONE-IODINE 10 % EX SWAB
2.0000 "application " | Freq: Once | CUTANEOUS | Status: AC
  Administered 2020-07-16: 2 via TOPICAL

## 2020-07-16 MED ORDER — PROPOFOL 10 MG/ML IV BOLUS
INTRAVENOUS | Status: AC
  Filled 2020-07-16: qty 20

## 2020-07-16 MED ORDER — FENTANYL CITRATE (PF) 100 MCG/2ML IJ SOLN
25.0000 ug | INTRAMUSCULAR | Status: DC | PRN
  Administered 2020-07-16 (×2): 50 ug via INTRAVENOUS

## 2020-07-16 MED ORDER — LACTATED RINGERS IV BOLUS
250.0000 mL | Freq: Once | INTRAVENOUS | Status: AC
  Administered 2020-07-16: 250 mL via INTRAVENOUS

## 2020-07-16 MED ORDER — ONDANSETRON HCL 4 MG PO TABS: 4.0000 mg | ORAL_TABLET | Freq: Three times a day (TID) | ORAL | 0 refills | Status: AC | PRN

## 2020-07-16 MED ORDER — DEXAMETHASONE SODIUM PHOSPHATE 10 MG/ML IJ SOLN
INTRAMUSCULAR | Status: AC
  Filled 2020-07-16: qty 1

## 2020-07-16 MED ORDER — 0.9 % SODIUM CHLORIDE (POUR BTL) OPTIME
TOPICAL | Status: DC | PRN
  Administered 2020-07-16: 1000 mL

## 2020-07-16 MED ORDER — BUPIVACAINE-EPINEPHRINE 0.25% -1:200000 IJ SOLN
INTRAMUSCULAR | Status: DC | PRN
  Administered 2020-07-16: 30 mL

## 2020-07-16 MED ORDER — POVIDONE-IODINE 10 % EX SWAB: 2.0000 "application " | Freq: Once | CUTANEOUS | Status: DC

## 2020-07-16 MED ORDER — PROMETHAZINE HCL 25 MG/ML IJ SOLN: 6.2500 mg | INTRAMUSCULAR | Status: DC | PRN

## 2020-07-16 MED ORDER — CEFAZOLIN SODIUM-DEXTROSE 2-4 GM/100ML-% IV SOLN
2.0000 g | INTRAVENOUS | Status: AC
  Administered 2020-07-16: 2 g via INTRAVENOUS
  Filled 2020-07-16: qty 100

## 2020-07-16 MED ORDER — DOCUSATE SODIUM 100 MG PO CAPS: 100.0000 mg | ORAL_CAPSULE | Freq: Two times a day (BID) | ORAL | 1 refills | Status: AC

## 2020-07-16 MED ORDER — BUPIVACAINE-EPINEPHRINE (PF) 0.25% -1:200000 IJ SOLN
INTRAMUSCULAR | Status: AC
  Filled 2020-07-16: qty 30

## 2020-07-16 MED ORDER — KETOROLAC TROMETHAMINE 30 MG/ML IJ SOLN
INTRAMUSCULAR | Status: AC
  Filled 2020-07-16: qty 1

## 2020-07-16 MED ORDER — ACETAMINOPHEN 10 MG/ML IV SOLN
1000.0000 mg | Freq: Once | INTRAVENOUS | Status: AC
  Administered 2020-07-16: 1000 mg via INTRAVENOUS
  Filled 2020-07-16: qty 100

## 2020-07-16 MED ORDER — DEXAMETHASONE SODIUM PHOSPHATE 10 MG/ML IJ SOLN
INTRAMUSCULAR | Status: DC | PRN
  Administered 2020-07-16: 5 mg

## 2020-07-16 MED ORDER — MIDAZOLAM HCL 2 MG/2ML IJ SOLN
INTRAMUSCULAR | Status: AC
  Filled 2020-07-16: qty 2

## 2020-07-16 MED ORDER — CHLORHEXIDINE GLUCONATE 0.12 % MT SOLN: 15.0000 mL | Freq: Once | OROMUCOSAL | Status: AC

## 2020-07-16 MED ORDER — LACTATED RINGERS IV SOLN: INTRAVENOUS | Status: DC

## 2020-07-16 MED ORDER — PROPOFOL 500 MG/50ML IV EMUL
INTRAVENOUS | Status: AC
  Filled 2020-07-16: qty 50

## 2020-07-16 MED ORDER — SODIUM CHLORIDE 0.9 % IR SOLN
Status: DC | PRN
  Administered 2020-07-16: 1000 mL

## 2020-07-16 MED ORDER — SODIUM CHLORIDE 0.9 % IV SOLN: INTRAVENOUS | Status: DC

## 2020-07-16 MED ORDER — MIDAZOLAM HCL 5 MG/5ML IJ SOLN
INTRAMUSCULAR | Status: DC | PRN
  Administered 2020-07-16 (×2): 1 mg via INTRAVENOUS

## 2020-07-16 MED ORDER — SODIUM CHLORIDE 0.9% FLUSH
INTRAVENOUS | Status: DC | PRN
  Administered 2020-07-16: 30 mL

## 2020-07-16 MED ORDER — LACTATED RINGERS IV BOLUS
500.0000 mL | Freq: Once | INTRAVENOUS | Status: AC
  Administered 2020-07-16: 500 mL via INTRAVENOUS

## 2020-07-16 MED ORDER — FENTANYL CITRATE (PF) 100 MCG/2ML IJ SOLN
INTRAMUSCULAR | Status: AC
  Filled 2020-07-16: qty 2

## 2020-07-16 MED ORDER — KETOROLAC TROMETHAMINE 30 MG/ML IJ SOLN
INTRAMUSCULAR | Status: DC | PRN
  Administered 2020-07-16: 30 mg

## 2020-07-16 MED ORDER — OXYCODONE HCL 5 MG PO TABS: 5.0000 mg | ORAL_TABLET | ORAL | 0 refills | Status: AC | PRN

## 2020-07-16 MED ORDER — OXYCODONE HCL 5 MG PO TABS
5.0000 mg | ORAL_TABLET | Freq: Once | ORAL | Status: AC | PRN
  Administered 2020-07-16: 5 mg via ORAL

## 2020-07-16 MED ORDER — ALBUMIN HUMAN 5 % IV SOLN: INTRAVENOUS | Status: DC | PRN

## 2020-07-16 MED ORDER — ALBUMIN HUMAN 5 % IV SOLN
INTRAVENOUS | Status: AC
  Filled 2020-07-16: qty 250

## 2020-07-16 MED ORDER — ONDANSETRON HCL 4 MG/2ML IJ SOLN
INTRAMUSCULAR | Status: DC | PRN
  Administered 2020-07-16: 4 mg via INTRAVENOUS

## 2020-07-16 MED ORDER — DEXAMETHASONE SODIUM PHOSPHATE 10 MG/ML IJ SOLN
INTRAMUSCULAR | Status: DC | PRN
  Administered 2020-07-16: 5 mg via INTRAVENOUS

## 2020-07-16 MED ORDER — ORAL CARE MOUTH RINSE
15.0000 mL | Freq: Once | OROMUCOSAL | Status: AC
  Administered 2020-07-16: 15 mL via OROMUCOSAL

## 2020-07-16 MED ORDER — TRANEXAMIC ACID-NACL 1000-0.7 MG/100ML-% IV SOLN: 1000.0000 mg | Freq: Once | INTRAVENOUS | Status: DC

## 2020-07-16 MED ORDER — STERILE WATER FOR IRRIGATION IR SOLN
Status: DC | PRN
  Administered 2020-07-16: 2000 mL

## 2020-07-16 MED ORDER — ONDANSETRON HCL 4 MG/2ML IJ SOLN
INTRAMUSCULAR | Status: AC
  Filled 2020-07-16: qty 2

## 2020-07-16 MED ORDER — SODIUM CHLORIDE (PF) 0.9 % IJ SOLN
INTRAMUSCULAR | Status: AC
  Filled 2020-07-16: qty 30

## 2020-07-16 MED ORDER — OXYCODONE HCL 5 MG PO TABS
ORAL_TABLET | ORAL | Status: AC
  Filled 2020-07-16: qty 1

## 2020-07-16 MED ORDER — PROPOFOL 10 MG/ML IV BOLUS
INTRAVENOUS | Status: DC | PRN
  Administered 2020-07-16 (×5): 20 mg via INTRAVENOUS

## 2020-07-16 MED ORDER — SENNA 8.6 MG PO TABS: 2.0000 | ORAL_TABLET | Freq: Every day | ORAL | 1 refills | Status: AC

## 2020-07-16 MED ORDER — ASPIRIN 81 MG PO CHEW: 81.0000 mg | CHEWABLE_TABLET | Freq: Two times a day (BID) | ORAL | 0 refills | Status: AC

## 2020-07-16 MED ORDER — OXYCODONE HCL 5 MG/5ML PO SOLN: 5.0000 mg | Freq: Once | ORAL | Status: AC | PRN

## 2020-07-16 MED ORDER — FENTANYL CITRATE (PF) 100 MCG/2ML IJ SOLN
INTRAMUSCULAR | Status: DC | PRN
  Administered 2020-07-16 (×2): 50 ug via INTRAVENOUS

## 2020-07-16 MED ORDER — ISOPROPYL ALCOHOL 70 % SOLN
Status: DC | PRN
  Administered 2020-07-16: 1 via TOPICAL

## 2020-07-16 MED ORDER — TRANEXAMIC ACID-NACL 1000-0.7 MG/100ML-% IV SOLN
1000.0000 mg | INTRAVENOUS | Status: AC
  Administered 2020-07-16: 1000 mg via INTRAVENOUS
  Filled 2020-07-16: qty 100

## 2020-07-16 MED ORDER — BUPIVACAINE IN DEXTROSE 0.75-8.25 % IT SOLN
INTRATHECAL | Status: DC | PRN
  Administered 2020-07-16: 1.8 mL via INTRATHECAL

## 2020-07-16 MED ORDER — CEFAZOLIN SODIUM-DEXTROSE 2-4 GM/100ML-% IV SOLN: 2.0000 g | Freq: Four times a day (QID) | INTRAVENOUS | Status: DC

## 2020-07-16 MED ORDER — PROPOFOL 500 MG/50ML IV EMUL
INTRAVENOUS | Status: DC | PRN
  Administered 2020-07-16: 75 ug/kg/min via INTRAVENOUS

## 2020-07-16 MED ORDER — ROPIVACAINE HCL 7.5 MG/ML IJ SOLN
INTRAMUSCULAR | Status: DC | PRN
  Administered 2020-07-16: 20 mL via PERINEURAL

## 2020-07-16 SURGICAL SUPPLY — 70 items
BAG ZIPLOCK 12X15 (MISCELLANEOUS) IMPLANT
BATTERY INSTRU NAVIGATION (MISCELLANEOUS) ×8 IMPLANT
BLADE SAW RECIPROCATING 77.5 (BLADE) ×2 IMPLANT
BNDG ELASTIC 4X5.8 VLCR STR LF (GAUZE/BANDAGES/DRESSINGS) ×2 IMPLANT
BNDG ELASTIC 6X5.8 VLCR STR LF (GAUZE/BANDAGES/DRESSINGS) ×2 IMPLANT
CHLORAPREP W/TINT 26 (MISCELLANEOUS) ×4 IMPLANT
COMP FEMORAL TRIATHLON LFT SZ7 (Orthopedic Implant) ×2 IMPLANT
COMPONENT FEMRL TRTHLON LT SZ7 (Orthopedic Implant) ×1 IMPLANT
COVER SURGICAL LIGHT HANDLE (MISCELLANEOUS) ×2 IMPLANT
COVER WAND RF STERILE (DRAPES) IMPLANT
CUFF TOURN SGL QUICK 34 (TOURNIQUET CUFF) ×2
CUFF TRNQT CYL 34X4.125X (TOURNIQUET CUFF) ×1 IMPLANT
DECANTER SPIKE VIAL GLASS SM (MISCELLANEOUS) ×8 IMPLANT
DERMABOND ADVANCED (GAUZE/BANDAGES/DRESSINGS) ×2
DERMABOND ADVANCED .7 DNX12 (GAUZE/BANDAGES/DRESSINGS) ×2 IMPLANT
DRAPE SHEET LG 3/4 BI-LAMINATE (DRAPES) ×6 IMPLANT
DRAPE U-SHAPE 47X51 STRL (DRAPES) ×2 IMPLANT
DRSG AQUACEL AG ADV 3.5X10 (GAUZE/BANDAGES/DRESSINGS) ×2 IMPLANT
DRSG TEGADERM 4X4.75 (GAUZE/BANDAGES/DRESSINGS) IMPLANT
ELECT BLADE TIP CTD 4 INCH (ELECTRODE) ×2 IMPLANT
ELECT REM PT RETURN 15FT ADLT (MISCELLANEOUS) ×2 IMPLANT
EVACUATOR 1/8 PVC DRAIN (DRAIN) IMPLANT
GAUZE SPONGE 4X4 12PLY STRL (GAUZE/BANDAGES/DRESSINGS) ×2 IMPLANT
GLOVE SRG 8 PF TXTR STRL LF DI (GLOVE) ×2 IMPLANT
GLOVE SURG ENC MOIS LTX SZ8.5 (GLOVE) ×4 IMPLANT
GLOVE SURG ENC TEXT LTX SZ7.5 (GLOVE) ×6 IMPLANT
GLOVE SURG UNDER POLY LF SZ8 (GLOVE) ×4
GLOVE SURG UNDER POLY LF SZ8.5 (GLOVE) ×2 IMPLANT
GOWN SPEC L3 XXLG W/TWL (GOWN DISPOSABLE) ×2 IMPLANT
GOWN SPEC L4 XLG W/TWL (GOWN DISPOSABLE) ×2 IMPLANT
HANDPIECE INTERPULSE COAX TIP (DISPOSABLE) ×2
HOLDER FOLEY CATH W/STRAP (MISCELLANEOUS) IMPLANT
HOOD PEEL AWAY FLYTE STAYCOOL (MISCELLANEOUS) ×6 IMPLANT
INSERT TIB TRI BEAR SZ 7 KNEE (Insert) ×2 IMPLANT
JET LAVAGE IRRISEPT WOUND (IRRIGATION / IRRIGATOR) ×2
KIT TURNOVER KIT A (KITS) ×2 IMPLANT
KNEE PATELLA ASYMMETRIC 10X35 (Knees) ×2 IMPLANT
KNEE TIBIAL COMPONENT SZ7 (Knees) ×2 IMPLANT
LAVAGE JET IRRISEPT WOUND (IRRIGATION / IRRIGATOR) ×1 IMPLANT
MARKER SKIN DUAL TIP RULER LAB (MISCELLANEOUS) ×2 IMPLANT
NDL SAFETY ECLIPSE 18X1.5 (NEEDLE) ×1 IMPLANT
NEEDLE HYPO 18GX1.5 SHARP (NEEDLE) ×2
NEEDLE SPNL 18GX3.5 QUINCKE PK (NEEDLE) ×2 IMPLANT
NS IRRIG 1000ML POUR BTL (IV SOLUTION) ×2 IMPLANT
PACK TOTAL KNEE CUSTOM (KITS) ×2 IMPLANT
PADDING CAST COTTON 6X4 STRL (CAST SUPPLIES) ×2 IMPLANT
PENCIL SMOKE EVACUATOR (MISCELLANEOUS) IMPLANT
PIN FLUTED HEDLESS FIX 3.5X1/8 (PIN) ×2 IMPLANT
PROTECTOR NERVE ULNAR (MISCELLANEOUS) ×2 IMPLANT
SAW OSC TIP CART 19.5X105X1.3 (SAW) ×2 IMPLANT
SEALER BIPOLAR AQUA 6.0 (INSTRUMENTS) ×2 IMPLANT
SET HNDPC FAN SPRY TIP SCT (DISPOSABLE) ×1 IMPLANT
SET PAD KNEE POSITIONER (MISCELLANEOUS) ×2 IMPLANT
SPONGE DRAIN TRACH 4X4 STRL 2S (GAUZE/BANDAGES/DRESSINGS) IMPLANT
SUT MNCRL AB 3-0 PS2 18 (SUTURE) ×2 IMPLANT
SUT MNCRL AB 4-0 PS2 18 (SUTURE) ×2 IMPLANT
SUT MON AB 2-0 CT1 36 (SUTURE) ×2 IMPLANT
SUT STRATAFIX PDO 1 14 VIOLET (SUTURE) ×2
SUT STRATFX PDO 1 14 VIOLET (SUTURE) ×1
SUT VIC AB 1 CTX 36 (SUTURE) ×4
SUT VIC AB 1 CTX36XBRD ANBCTR (SUTURE) ×2 IMPLANT
SUT VIC AB 2-0 CT1 27 (SUTURE) ×2
SUT VIC AB 2-0 CT1 TAPERPNT 27 (SUTURE) ×1 IMPLANT
SUTURE STRATFX PDO 1 14 VIOLET (SUTURE) ×1 IMPLANT
SYR 3ML LL SCALE MARK (SYRINGE) ×2 IMPLANT
TOWER CARTRIDGE SMART MIX (DISPOSABLE) IMPLANT
TRAY FOLEY MTR SLVR 16FR STAT (SET/KITS/TRAYS/PACK) ×2 IMPLANT
TUBE SUCTION HIGH CAP CLEAR NV (SUCTIONS) ×2 IMPLANT
WATER STERILE IRR 1000ML POUR (IV SOLUTION) ×4 IMPLANT
WRAP KNEE MAXI GEL POST OP (GAUZE/BANDAGES/DRESSINGS) ×2 IMPLANT

## 2020-07-16 NOTE — Transfer of Care (Signed)
Immediate Anesthesia Transfer of Care Note  Patient: Willie Phillips  Procedure(s) Performed: COMPUTER ASSISTED TOTAL KNEE ARTHROPLASTY (Left Knee)  Patient Location: PACU  Anesthesia Type:Spinal  Level of Consciousness: drowsy and patient cooperative  Airway & Oxygen Therapy: Patient Spontanous Breathing and Patient connected to face mask oxygen  Post-op Assessment: Report given to RN and Post -op Vital signs reviewed and stable  Post vital signs: Reviewed and stable  Last Vitals:  Vitals Value Taken Time  BP 128/87 07/16/20 1046  Temp    Pulse 49 07/16/20 1051  Resp 21 07/16/20 1051  SpO2 100 % 07/16/20 1051  Vitals shown include unvalidated device data.  Last Pain:  Vitals:   07/16/20 0558  TempSrc: Oral         Complications: No complications documented.

## 2020-07-16 NOTE — Progress Notes (Signed)
Physical Therapy Treatment Patient Details Name: Willie Phillips MRN: [id number] DOB: 1960-08-14 Today's Date: 07/16/2020    History of Present Illness Patient is 60 y.o. male s/p Lt TKA on 07/16/20 with PMH significant for PA, PVD.    PT Comments    Patient demonstrated good carryover for safe technique with transfers and gait with RW. Patient's wife provided safe guarding during gait with supervision and instruction from therapist. Pt denied dizziness, lightheadedness and BP stable throughout and after gait. Educated pt on HEP for ROM, circulation, and strengthening. All questions addressed and educated on safety strategies for returning home (such as having chairs around for seated rest if pt becomes dizzy). Patient is currently at safe mobility level to discharge home with assist from spouse. Acute PT will continue to progress pt if he remains in acute setting.   Follow Up Recommendations  Follow surgeon's recommendation for DC plan and follow-up therapies;Outpatient PT     Equipment Recommendations  None recommended by PT    Recommendations for Other Services       Precautions / Restrictions Precautions Precautions: Fall Restrictions Weight Bearing Restrictions: No Other Position/Activity Restrictions: WBAT    Mobility  Bed Mobility               General bed mobility comments: pt OOB in recliner    Transfers Overall transfer level: Needs assistance Equipment used: Rolling walker (2 wheeled) Transfers: Sit to/from Stand Sit to Stand: Min guard;Supervision         General transfer comment: cues for technique at start and pt demonstrated carryover throughout. min guard/supervision for safety, no assist needed to rise and pt with good recall to kick Lt LE forward when sitting on low surface.  Ambulation/Gait Ambulation/Gait assistance: Min guard;Supervision Gait Distance (Feet): 100 Feet Assistive device: Rolling walker (2 wheeled) Gait Pattern/deviations:  Step-to pattern;Decreased stride length;Decreased weight shift to left Gait velocity: decr   General Gait Details: Pt demonstrated good carryover for step pattern and proximity to RW, pt's wife provided cues 2x to keep walker on ground. pt's wife provided safe guarding during gait wtih supervision from therapist.   Stairs             Wheelchair Mobility    Modified Rankin (Stroke Patients Only)       Balance Overall balance assessment: Needs assistance Sitting-balance support: Feet supported Sitting balance-Leahy Scale: Good     Standing balance support: During functional activity;Bilateral upper extremity supported Standing balance-Leahy Scale: Fair                              Cognition Arousal/Alertness: Awake/alert Behavior During Therapy: WFL for tasks assessed/performed Overall Cognitive Status: Within Functional Limits for tasks assessed                                        Exercises Total Joint Exercises Ankle Circles/Pumps: AROM;Both;15 reps;Seated Quad Sets: AROM;Left;Other reps (comment);Seated (2) Short Arc Quad: AROM;Left;Other reps (comment);Seated (2) Heel Slides: AROM;Left;Other reps (comment);Seated (2) Hip ABduction/ADduction: AROM;Left;Other reps (comment);Seated (2) Straight Leg Raises: AROM;Left;Other reps (comment);Seated (2)    General Comments        Pertinent Vitals/Pain Pain Assessment: 0-10 Pain Score: 2  Pain Location: Lt knee Pain Descriptors / Indicators: Aching;Discomfort;Sore Pain Intervention(s): Limited activity within patient's tolerance;Monitored during session;Repositioned    Home Living  Prior Function            PT Goals (current goals can now be found in the care plan section) Acute Rehab PT Goals 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 Goal Formulation: With patient Time For Goal Achievement: 07/23/20 Potential to Achieve Goals:  Good Progress towards PT goals: Progressing toward goals    Frequency    7X/week      PT Plan Current plan remains appropriate    Co-evaluation              AM-PAC PT "6 Clicks" Mobility   Outcome Measure  Help needed turning from your back to your side while in a flat bed without using bedrails?: A Little Help needed moving from lying on your back to sitting on the side of a flat bed without using bedrails?: A Little Help needed moving to and from a bed to a chair (including a wheelchair)?: A Little Help needed standing up from a chair using your arms (e.g., wheelchair or bedside chair)?: A Little Help needed to walk in hospital room?: A Little Help needed climbing 3-5 steps with a railing? : A Little 6 Click Score: 18    End of Session Equipment Utilized During Treatment: Gait belt Activity Tolerance: Patient tolerated treatment well;Treatment limited secondary to medical complications (Comment) (orthostatic hypotension) Patient left: in chair;with call bell/phone within reach;with family/visitor present;with nursing/sitter in room Nurse Communication: Mobility status;Other (comment) (hypotension) PT Visit Diagnosis: Muscle weakness (generalized) (M62.81);Difficulty in walking, not elsewhere classified (R26.2)     Time: 2458-0998 PT Time Calculation (min) (ACUTE ONLY): 31 min  Charges:  $Gait Training: 8-22 mins $Therapeutic Exercise: 8-22 mins                     Wynn Maudlin, DPT Acute Rehabilitation Services Office [phone] Pager [phone]     Anitra Lauth 07/16/2020, 6:34 PM

## 2020-07-16 NOTE — Anesthesia Postprocedure Evaluation (Signed)
Anesthesia Post Note  Patient: Willie Phillips  Procedure(s) Performed: COMPUTER ASSISTED TOTAL KNEE ARTHROPLASTY (Left Knee)     Patient location during evaluation: PACU Anesthesia Type: Regional, MAC and Spinal Level of consciousness: awake and alert Pain management: pain level controlled Vital Signs Assessment: post-procedure vital signs reviewed and stable Respiratory status: spontaneous breathing, nonlabored ventilation, respiratory function stable and patient connected to nasal cannula oxygen Cardiovascular status: stable and blood pressure returned to baseline Postop Assessment: no apparent nausea or vomiting Anesthetic complications: no   No complications documented.  Last Vitals:  Vitals:   07/16/20 1440 07/16/20 1450  BP:  124/77  Pulse:  62  Resp:    Temp:    SpO2: 98% 97%    Last Pain:  Vitals:   07/16/20 1352  TempSrc:   PainSc: 0-No pain                 Belenda Cruise P Ferris Tally

## 2020-07-16 NOTE — Anesthesia Procedure Notes (Signed)
Anesthesia Regional Block: Adductor canal block   Pre-Anesthetic Checklist: ,, timeout performed, Correct Patient, Correct Site, Correct Laterality, Correct Procedure, Correct Position, site marked, Risks and benefits discussed,  Surgical consent,  Pre-op evaluation,  At surgeon's request and post-op pain management  Laterality: Left  Prep: Dura Prep       Needles:  Injection technique: Single-shot  Needle Type: Echogenic Stimulator Needle     Needle Length: 10cm  Needle Gauge: 20     Additional Needles:   Procedures:,,,, ultrasound used (permanent image in chart),,,,  Narrative:  Start time: 07/16/2020 6:53 AM End time: 07/16/2020 6:58 AM Injection made incrementally with aspirations every 5 mL.  Performed by: Personally  Anesthesiologist: Atilano Median, DO  Additional Notes: Patient identified. Risks/Benefits/Options discussed with patient including but not limited to bleeding, infection, nerve damage, failed block, incomplete pain control. Patient expressed understanding and wished to proceed. All questions were answered. Sterile technique was used throughout the entire procedure. Please see nursing notes for vital signs. Aspirated in 5cc intervals with injection for negative confirmation. Patient was given instructions on fall risk and not to get out of bed. All questions and concerns addressed with instructions to call with any issues or inadequate analgesia.

## 2020-07-16 NOTE — Discharge Instructions (Signed)
 Dr. Yaden Swinteck Total Joint Specialist [facility] [address] [place], [place] [address] [phone]  TOTAL KNEE REPLACEMENT POSTOPERATIVE DIRECTIONS    Knee Rehabilitation, Guidelines Following Surgery  Results after knee surgery are often greatly improved when you follow the exercise, range of motion and muscle strengthening exercises prescribed by your doctor. Safety measures are also important to protect the knee from further injury. Any time any of these exercises cause you to have increased pain or swelling in your knee joint, decrease the amount until you are comfortable again and slowly increase them. If you have problems or questions, call your caregiver or physical therapist for advice.   WEIGHT BEARING Weight bearing as tolerated with assist device (walker, cane, etc) as directed, use it as long as suggested by your surgeon or therapist, typically at least 4-6 weeks.  HOME CARE INSTRUCTIONS  . Remove items at home which could result in a fall. This includes throw rugs or furniture in walking pathways.  . Continue medications as instructed at time of discharge. . You may have some home medications which will be placed on hold until you complete the course of blood thinner medication.  . You may start showering once you are discharged home but do not submerge the incision under water. Just pat the incision dry and apply a dry gauze dressing on daily. . Walk with walker as instructed.  . You may resume a sexual relationship in one month or when given the OK by your doctor.   Use walker as long as suggested by your caregivers.  Avoid periods of inactivity such as sitting longer than an hour when not asleep. This helps prevent blood clots.  . You may put full weight on your legs and walk as much as is comfortable.  . You may return to work once you are cleared by your doctor.  . Do not drive a car for 6 weeks or until released by you surgeon.    Do not drive while taking narcotics.  . Wear the elastic stockings for three weeks following surgery during the day but you may remove then at night. . Make sure you keep all of your appointments after your operation with all of your doctors and caregivers. You should call the office at the above phone number and make an appointment for approximately two weeks after the date of your surgery. . Do not remove your surgical dressing. The dressing is waterproof; you may take showers in 3 days, but do not take tub baths or submerge the dressing. . Please pick up a stool softener and laxative for home use as long as you are requiring pain medications.  ICE to the affected knee every three hours for 30 minutes at a time and then as needed for pain and swelling.  Continue to use ice on the knee for pain and swelling from surgery. You may notice swelling that will progress down to the foot and ankle.  This is normal after surgery.  Elevate the leg when you are not up walking on it.   . It is important for you to complete the blood thinner medication as prescribed by your doctor.  Continue to use the breathing machine which will help keep your temperature down.  It is common for your temperature to cycle up and down following surgery, especially at night when you are not up moving around and exerting yourself.  The breathing machine keeps your lungs expanded and your temperature down.    RANGE OF MOTION AND STRENGTHENING EXERCISES  Rehabilitation of the knee is important following a knee injury or an operation. After just a few days of immobilization, the muscles of the thigh which control the knee become weakened and shrink (atrophy). Knee exercises are designed to build up the tone and strength of the thigh muscles and to improve knee motion. Often times heat used for twenty to thirty minutes before working out will loosen up your tissues and help with improving the range of motion but do not use heat for the  first two weeks following surgery. These exercises can be done on a training (exercise) mat, on the floor, on a table or on a bed. Use what ever works the best and is most comfortable for you Knee exercises include:  . Leg Lifts - While your knee is still immobilized in a splint or cast, you can do straight leg raises. Lift the leg to 60 degrees, hold for 3 sec, and slowly lower the leg. Repeat 10-20 times 2-3 times daily. Perform this exercise against resistance later as your knee gets better.  . Quad and Hamstring Sets - Tighten up the muscle on the front of the thigh (Quad) and hold for 5-10 sec. Repeat this 10-20 times hourly. Hamstring sets are done by pushing the foot backward against an object and holding for 5-10 sec. Repeat as with quad sets.  A rehabilitation program following serious knee injuries can speed recovery and prevent re-injury in the future due to weakened muscles. Contact your doctor or a physical therapist for more information on knee rehabilitation.   POST-OPERATIVE OPIOID TAPER INSTRUCTIONS: . It is important to wean off of your opioid medication as soon as possible. If you do not need pain medication after your surgery it is ok to stop day one. . Opioids include: o Codeine, Hydrocodone(Norco, Vicodin), Oxycodone(Percocet, oxycontin) and hydromorphone amongst others.  . Long term and even short term use of opiods can cause: o Increased pain response o Dependence o Constipation o Depression o Respiratory depression o And more.  . Withdrawal symptoms can include o Flu like symptoms o Nausea, vomiting o And more . Techniques to manage these symptoms o Hydrate well o Eat regular healthy meals o Stay active o Use relaxation techniques(deep breathing, meditating, yoga) . Do Not substitute Alcohol to help with tapering . If you have been on opioids for less than two weeks and do not have pain than it is ok to stop all together.  . Plan to wean off of opioids o This plan  should start within one week post op of your joint replacement. o Maintain the same interval or time between taking each dose and first decrease the dose.  o Cut the total daily intake of opioids by one tablet each day o Next start to increase the time between doses. o The last dose that should be eliminated is the evening dose.      SKILLED REHAB INSTRUCTIONS: If the patient is transferred to a skilled rehab facility following release from the hospital, a list of the current medications will be sent to the facility for the patient to continue.  When discharged from the skilled rehab facility, please have the facility set up the patient's Home Health Physical Therapy prior to being released. Also, the skilled facility will be responsible for providing the patient with their medications at time of release from the facility to include their pain medication, the muscle relaxants, and their blood thinner medication. If the   patient is still at the rehab facility at time of the two week follow up appointment, the skilled rehab facility will also need to assist the patient in arranging follow up appointment in our office and any transportation needs.  MAKE SURE YOU:  . Understand these instructions.  . Will watch your condition.  . Will get help right away if you are not doing well or get worse.    Pick up stool softner and laxative for home use following surgery while on pain medications. Do NOT remove your dressing. You may shower.  Do not take tub baths or submerge incision under water. May shower starting three days after surgery. Please use a clean towel to pat the incision dry following showers. Continue to use ice for pain and swelling after surgery. Do not use any lotions or creams on the incision until instructed by your surgeon.  

## 2020-07-16 NOTE — Evaluation (Signed)
Physical Therapy Evaluation Patient Details Name: Willie Phillips MRN: [id number] DOB: 1960/05/18 Today's Date: 07/16/2020   History of Present Illness  Patient is 60 y.o. male s/p Lt TKA on 07/16/20 with PMH significant for PA, PVD.  Clinical Impression  Willie Phillips is a 60 y.o. male POD 0 s/p Lt TKA. Patient reports independence with mobility at baseline. Patient is now limited by functional impairments (see PT problem list below) and requires min guard/assist for transfers and gait with RW. Patient was able to ambulate ~90 feet with RW and min assist progressing to min guard with cues for safe walker management. Patient educated on safe sequencing for stair mobility and verbalized safe guarding position for people assisting with mobility. Patient wife provided safe guarding for gait and stairs with instruction from therapist. Following stair mobility pt c/o dizziness, lightheadedness, and nausea. Pt required min assist to sit and then to complete stand pivot transfer to recliner. BP found to be 80/49 mmHg and after 3 minutes semi-recliner rest dropped further to 71/48 mmHg and pt laid flat in chair and RN providing care. Patient will benefit from continued skilled PT interventions to address impairments and progress towards PLOF. Acute PT will follow up with pt for additional session to progress mobility for safe discharge as able.    Follow Up Recommendations Follow surgeon's recommendation for DC plan and follow-up therapies;Outpatient PT    Equipment Recommendations  None recommended by PT    Recommendations for Other Services       Precautions / Restrictions Precautions Precautions: Fall Restrictions Weight Bearing Restrictions: No Other Position/Activity Restrictions: WBAT      Mobility  Bed Mobility Overal bed mobility: Needs Assistance Bed Mobility: Supine to Sit     Supine to sit: Supervision;HOB elevated     General bed mobility comments: pt taking some increased  time, no assist or cues needed.    Transfers Overall transfer level: Needs assistance Equipment used: Rolling walker (2 wheeled) Transfers: Sit to/from [employer] Sit to Stand: Min guard;Supervision;From elevated surface Stand pivot transfers: Min assist       General transfer comment: Min cues for safe hand placement/technique with RW to rise form EOB. Cues for safe Lt knee extension with sitting on low toilet and cues for use of grab bar to rise. Min Assist required for seat in chair after pt c/o lightheaded sensation. Min Assist to rise and complete stand step/pivot to recliner to be laid flat.  Ambulation/Gait Ambulation/Gait assistance: Min assist;Min guard Gait Distance (Feet): 90 Feet Assistive device: Rolling walker (2 wheeled) Gait Pattern/deviations: Step-to pattern;Decreased stride length;Decreased weight shift to left Gait velocity: decr   General Gait Details: cues for safe step to pattern and proximity to RW. Assist initially with pt progressing to min guard pattern. Pt's wife provided guarding with instruction from therapist for part of gait training.  Stairs Stairs: Yes Stairs assistance: Min assist Stair Management: Step to pattern;One rail Right;Forwards;With cane Number of Stairs: 3 General stair comments: VC's for safe sequencing and step pattern "up with good, down with bad" and for safe guarding position for pt's wife to provide.  Wheelchair Mobility    Modified Rankin (Stroke Patients Only)       Balance Overall balance assessment: Needs assistance Sitting-balance support: Feet supported Sitting balance-Leahy Scale: Good     Standing balance support: During functional activity;Bilateral upper extremity supported Standing balance-Leahy Scale: Fair  Pertinent Vitals/Pain Pain Assessment: 0-10 Pain Score: 2  Pain Location: Lt knee Pain Descriptors / Indicators:  Aching;Discomfort;Sore Pain Intervention(s): Limited activity within patient's tolerance;Monitored during session;Repositioned    Home Living Family/patient expects to be discharged to:: Private residence Living Arrangements: Spouse/significant other Available Help at Discharge: Family Type of Home: House Home Access: Stairs to enter Entrance Stairs-Rails: Doctor, general practice of Steps: 4 Home Layout: Two level;Bed/bath upstairs Home Equipment: Walker - 2 wheels;Cane - single point      Prior Function Level of Independence: Independent         Comments: 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     Hand Dominance   Dominant Hand: Right    Extremity/Trunk Assessment   Upper Extremity Assessment Upper Extremity Assessment: Overall WFL for tasks assessed    Lower Extremity Assessment Lower Extremity Assessment: LLE deficits/detail LLE Deficits / Details: good quad activation, no extensor lag with SLR, 4/5 for ankle dorsi/plantar flexion LLE Sensation: WNL LLE Coordination: WNL    Cervical / Trunk Assessment Cervical / Trunk Assessment: Normal  Communication   Communication: No difficulties  Cognition Arousal/Alertness: Awake/alert Behavior During Therapy: WFL for tasks assessed/performed Overall Cognitive Status: Within Functional Limits for tasks assessed                                        General Comments      Exercises     Assessment/Plan    PT Assessment Patient needs continued PT services  PT Problem List Decreased range of motion;Decreased strength;Decreased activity tolerance;Decreased balance;Decreased mobility;Decreased knowledge of use of DME;Decreased knowledge of precautions;Decreased safety awareness       PT Treatment Interventions DME instruction;Gait training;Stair training;Functional mobility training;Therapeutic activities;Therapeutic exercise;Balance training;Patient/family education    PT  Goals (Current goals can be found in the Care Plan section)  Acute Rehab PT Goals 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 Goal Formulation: With patient Time For Goal Achievement: 07/23/20 Potential to Achieve Goals: Good    Frequency 7X/week   Barriers to discharge        Co-evaluation               AM-PAC PT "6 Clicks" Mobility  Outcome Measure Help needed turning from your back to your side while in a flat bed without using bedrails?: A Little Help needed moving from lying on your back to sitting on the side of a flat bed without using bedrails?: A Little Help needed moving to and from a bed to a chair (including a wheelchair)?: A Little Help needed standing up from a chair using your arms (e.g., wheelchair or bedside chair)?: A Little Help needed to walk in hospital room?: A Little Help needed climbing 3-5 steps with a railing? : A Little 6 Click Score: 18    End of Session Equipment Utilized During Treatment: Gait belt Activity Tolerance: Patient tolerated treatment well;Treatment limited secondary to medical complications (Comment) (orthostatic hypotension) Patient left: in chair;with call bell/phone within reach;with family/visitor present;with nursing/sitter in room Nurse Communication: Mobility status;Other (comment) (hypotension) PT Visit Diagnosis: Muscle weakness (generalized) (M62.81);Difficulty in walking, not elsewhere classified (R26.2)    Time: 2376-2831 PT Time Calculation (min) (ACUTE ONLY): 34 min   Charges:   PT Evaluation $PT Eval Low Complexity: 1 Low PT Treatments $Gait Training: 8-22 mins        Renaldo Fiddler PT, DPT Acute Rehabilitation Services  Office [phone] Pager [phone]    Anitra Lauth 07/16/2020, 4:57 PM

## 2020-07-16 NOTE — Interval H&P Note (Signed)
History and Physical Interval Note:  07/16/2020 7:46 AM  Willie Phillips  has presented today for surgery, with the diagnosis of Left knee osteoarthritis.  The various methods of treatment have been discussed with the patient and family. After consideration of risks, benefits and other options for treatment, the patient has consented to  Procedure(s): COMPUTER ASSISTED TOTAL KNEE ARTHROPLASTY (Left) as a surgical intervention.  The patient's history has been reviewed, patient examined, no change in status, stable for surgery.  I have reviewed the patient's chart and labs.  Questions were answered to the patient's satisfaction.    The risks, benefits, and alternatives were discussed with the patient. There are risks associated with the surgery including, but not limited to, problems with anesthesia (death), infection, instability (giving out of the joint), dislocation, differences in leg length/angulation/rotation, fracture of bones, loosening or failure of implants, hematoma (blood accumulation) which may require surgical drainage, blood clots, pulmonary embolism, nerve injury (foot drop and lateral thigh numbness), and blood vessel injury. The patient understands these risks and elects to proceed.    Jishnu Jenniges Navreet Bolda

## 2020-07-16 NOTE — Op Note (Signed)
OPERATIVE REPORT  SURGEON: Rod Can, MD   ASSISTANT: Cherlynn June, PA-C  PREOPERATIVE DIAGNOSIS: Left knee arthritis.   POSTOPERATIVE DIAGNOSIS: Left knee arthritis.   PROCEDURE: Left total knee arthroplasty.   IMPLANTS: Stryker Triathlon CR femur, size 7. Stryker Tritanium tibia, size 7. X3 polyethelyene insert, size 11 mm, CR. 3 button asymmetric patella, size 35 mm.  ANESTHESIA:  MAC, [facility] Spinal  TOURNIQUET TIME: Not utilized.   ESTIMATED BLOOD LOSS:-800 mL    ANTIBIOTICS: 2g Ancef.  DRAINS: None.  COMPLICATIONS: None   CONDITION: PACU - hemodynamically stable.   BRIEF CLINICAL NOTE: Willie Phillips is a 60 y.o. male with a long-standing history of Left knee arthritis. After failing conservative management, the patient was indicated for total knee arthroplasty. The risks, benefits, and alternatives to the procedure were explained, and the patient elected to proceed.  PROCEDURE IN DETAIL: Adductor canal block was obtained in the pre-op holding area. Once inside the operative room, spinal anesthesia was obtained, and a foley catheter was inserted. The patient was then positioned, a nonsterile tourniquet was placed, and the lower extremity was prepped and draped in the normal sterile surgical fashion.  A time-out was called verifying side and site of surgery. The patient received IV antibiotics within 60 minutes of beginning the procedure. The tourniquet was not utilized.   An anterior approach to the knee was performed utilizing a midvastus arthrotomy. A medial release was performed and the patellar fat pad was excised. Stryker navigation was used to cut the distal femur perpendicular to the mechanical axis. A freehand patellar resection was performed, and the patella was sized an prepared with 3 lug holes.  Nagivation was used to make a neutral proximal tibia resection, taking 9 mm of bone from the less  affected lateral side with 3 degrees of slope. The menisci were excised. A spacer block was placed, and the alignment and balance in extension were confirmed.   The distal femur was sized using the 3-degree external rotation guide referencing the posterior femoral cortex. The appropriate 4-in-1 cutting block was pinned into place. Rotation was checked using Whiteside's line, the epicondylar axis, and then confirmed with a spacer block in flexion. The remaining femoral cuts were performed, taking care to protect the MCL.  The tibia was sized and the trial tray was pinned into place. The remaining trail components were inserted. The knee was stable to varus and valgus stress through a full range of motion. The patella tracked centrally, and the PCL was well balanced. The trial components were removed, and the proximal tibial surface was prepared. Final components were impacted into place. The knee was tested for a final time and found to be well balanced.   The wound was copiously irrigated with Irrisept solution and normal saline using pule lavage.  Marcaine solution was injected into the periarticular soft tissue.  The wound was closed in layers using #1 Vicryl and Stratafix for the fascia, 2-0 Vicryl for the subcutaneous fat, 2-0 Monocryl for the deep dermal layer, 3-0 running Monocryl subcuticular Stitch, and 4-0 Monocryl stay sutures at both ends of the wound. Dermabond was applied to the skin.  Once the glue was fully dried, an Aquacell Ag and compressive dressing were applied.  Tthe patient was transported to the recovery room in stable condition.  Sponge, needle, and instrument counts were correct at the end of the case x2.  The patient tolerated the procedure well and there were no known complications.  Please note that a  surgical assistant was a medical necessity for this procedure in order to perform it in a safe and expeditious manner. Surgical assistant was necessary to retract the ligaments and  vital neurovascular structures to prevent injury to them and also necessary for proper positioning of the limb to allow for anatomic placement of the prosthesis.

## 2020-07-16 NOTE — Anesthesia Procedure Notes (Signed)
Procedure Name: MAC Date/Time: 07/16/2020 7:50 AM Performed by: Lollie Sails, CRNA Pre-anesthesia Checklist: Patient identified, Emergency Drugs available, Suction available, Patient being monitored and Timeout performed Oxygen Delivery Method: Simple face mask

## 2020-07-16 NOTE — Anesthesia Procedure Notes (Signed)
Spinal  Patient location during procedure: OR Start time: 07/16/2020 7:51 AM End time: 07/16/2020 7:58 AM Reason for block: surgical anesthesia Staffing Performed: resident/CRNA  Resident/CRNA: Lollie Sails, CRNA Preanesthetic Checklist Completed: patient identified, IV checked, site marked, risks and benefits discussed, surgical consent, monitors and equipment checked, pre-op evaluation and timeout performed Spinal Block Patient position: sitting Prep: DuraPrep and site prepped and draped Patient monitoring: heart rate, continuous pulse ox, blood pressure and cardiac monitor Approach: midline Location: L3-4 Injection technique: single-shot Needle Needle type: Spinocan  Needle gauge: 24 G Needle length: 10 cm Assessment Events: CSF return Additional Notes Expiration date on kit noted and within range.  Sterile prep and drape.  Good CSF flow noted with no heme or c/o paresthesia.   Patient tolerated well.

## 2020-07-17 ENCOUNTER — Encounter (HOSPITAL_COMMUNITY): Payer: Self-pay | Admitting: Orthopedic Surgery
